# Patient Record
Sex: Male | Born: 1945 | Race: White | Hispanic: No | Marital: Married | Smoking: Former smoker
Health system: Southern US, Community
[De-identification: ages and names within clinical notes are randomized; demographics above are authoritative.]

## PROBLEM LIST (undated history)

## (undated) DIAGNOSIS — E119 Type 2 diabetes mellitus without complications: Secondary | ICD-10-CM

## (undated) DIAGNOSIS — K2270 Barrett's esophagus without dysplasia: Secondary | ICD-10-CM

## (undated) DIAGNOSIS — J449 Chronic obstructive pulmonary disease, unspecified: Secondary | ICD-10-CM

## (undated) DIAGNOSIS — G35 Multiple sclerosis: Secondary | ICD-10-CM

## (undated) HISTORY — PX: APPENDECTOMY: SHX54

---

## 2002-02-23 ENCOUNTER — Emergency Department (HOSPITAL_COMMUNITY): Admission: EM | Admit: 2002-02-23 | Discharge: 2002-02-23 | Payer: Self-pay

## 2004-04-28 ENCOUNTER — Other Ambulatory Visit: Payer: Self-pay

## 2011-10-06 ENCOUNTER — Telehealth: Payer: Self-pay | Admitting: *Deleted

## 2011-10-06 NOTE — Telephone Encounter (Signed)
Opened in error

## 2019-04-15 ENCOUNTER — Emergency Department
Admission: EM | Admit: 2019-04-15 | Discharge: 2019-04-15 | Disposition: A | Discharge status: Home / Self Care | Payer: Medicare Other | Attending: Emergency Medicine | Admitting: Emergency Medicine

## 2019-04-15 ENCOUNTER — Emergency Department: Payer: Medicare Other

## 2019-04-15 ENCOUNTER — Other Ambulatory Visit: Payer: Self-pay

## 2019-04-15 DIAGNOSIS — E119 Type 2 diabetes mellitus without complications: Secondary | ICD-10-CM | POA: Diagnosis not present

## 2019-04-15 DIAGNOSIS — Z20828 Contact with and (suspected) exposure to other viral communicable diseases: Secondary | ICD-10-CM | POA: Insufficient documentation

## 2019-04-15 DIAGNOSIS — Z87891 Personal history of nicotine dependence: Secondary | ICD-10-CM | POA: Diagnosis not present

## 2019-04-15 DIAGNOSIS — J441 Chronic obstructive pulmonary disease with (acute) exacerbation: Secondary | ICD-10-CM | POA: Diagnosis not present

## 2019-04-15 DIAGNOSIS — R0602 Shortness of breath: Secondary | ICD-10-CM | POA: Diagnosis present

## 2019-04-15 HISTORY — DX: Chronic obstructive pulmonary disease, unspecified: J44.9

## 2019-04-15 HISTORY — DX: Type 2 diabetes mellitus without complications: E11.9

## 2019-04-15 LAB — DIFFERENTIAL
Basophils Absolute: 0.1 10*3/uL (ref 0.0–0.1)
Basophils Relative: 1 %
Eosinophils Absolute: 0.7 10*3/uL — ABNORMAL HIGH (ref 0.0–0.5)
Eosinophils Relative: 9 %
Lymphocytes Relative: 13 %
Lymphs Abs: 1 10*3/uL (ref 0.7–4.0)
Monocytes Absolute: 0.8 10*3/uL (ref 0.1–1.0)
Monocytes Relative: 10 %
Neutro Abs: 5.2 10*3/uL (ref 1.7–7.7)
Neutrophils Relative %: 68 %

## 2019-04-15 LAB — CBC
HCT: 44.7 % (ref 39.0–52.0)
Hemoglobin: 15.6 g/dL (ref 13.0–17.0)
MCH: 29.2 pg (ref 26.0–34.0)
MCHC: 34.9 g/dL (ref 30.0–36.0)
MCV: 83.6 fL (ref 80.0–100.0)
Platelets: 281 10*3/uL (ref 150–400)
RBC: 5.35 MIL/uL (ref 4.22–5.81)
RDW: 13.2 % (ref 11.5–15.5)
WBC: 7.8 10*3/uL (ref 4.0–10.5)
nRBC: 0 % (ref 0.0–0.2)

## 2019-04-15 LAB — BASIC METABOLIC PANEL
Anion gap: 7 (ref 5–15)
BUN: 13 mg/dL (ref 8–23)
CO2: 28 mmol/L (ref 22–32)
Calcium: 8.9 mg/dL (ref 8.9–10.3)
Chloride: 96 mmol/L — ABNORMAL LOW (ref 98–111)
Creatinine, Ser: 0.93 mg/dL (ref 0.61–1.24)
GFR calc Af Amer: >60 mL/min (ref >60)
GFR calc non Af Amer: >60 mL/min (ref >60)
Glucose, Bld: 124 mg/dL — ABNORMAL HIGH (ref 70–99)
Potassium: 3.6 mmol/L (ref 3.5–5.1)
Sodium: 131 mmol/L — ABNORMAL LOW (ref 135–145)

## 2019-04-15 LAB — SARS CORONAVIRUS 2 BY RT PCR (HOSPITAL ORDER, PERFORMED IN ~~LOC~~ HOSPITAL LAB): SARS Coronavirus 2: NEGATIVE

## 2019-04-15 MED ORDER — IPRATROPIUM-ALBUTEROL 0.5-2.5 (3) MG/3ML IN SOLN
3.0000 mL | Freq: Once | RESPIRATORY_TRACT | Status: AC
  Administered 2019-04-15: 3 mL via RESPIRATORY_TRACT

## 2019-04-15 MED ORDER — ALBUTEROL SULFATE HFA 108 (90 BASE) MCG/ACT IN AERS: 2.0000 | INHALATION_SPRAY | Freq: Four times a day (QID) | RESPIRATORY_TRACT | 1 refills | Status: DC | PRN

## 2019-04-15 MED ORDER — IPRATROPIUM-ALBUTEROL 0.5-2.5 (3) MG/3ML IN SOLN
3.0000 mL | Freq: Once | RESPIRATORY_TRACT | Status: AC
  Administered 2019-04-15: 08:00:00 3 mL via RESPIRATORY_TRACT
  Filled 2019-04-15: qty 3

## 2019-04-15 MED ORDER — PREDNISONE 50 MG PO TABS: 50.0000 mg | ORAL_TABLET | Freq: Every day | ORAL | 0 refills | Status: DC

## 2019-04-15 MED ORDER — METHYLPREDNISOLONE SODIUM SUCC 125 MG IJ SOLR
125.0000 mg | Freq: Once | INTRAMUSCULAR | Status: AC
  Administered 2019-04-15: 125 mg via INTRAVENOUS
  Filled 2019-04-15: qty 2

## 2019-04-15 MED ORDER — IPRATROPIUM-ALBUTEROL 0.5-2.5 (3) MG/3ML IN SOLN
3.0000 mL | Freq: Once | RESPIRATORY_TRACT | Status: AC
  Administered 2019-04-15: 3 mL via RESPIRATORY_TRACT
  Filled 2019-04-15: qty 3

## 2019-04-15 NOTE — ED Notes (Signed)
Pt up to toilet in room.

## 2019-04-15 NOTE — ED Provider Notes (Signed)
[facility] Emergency Department Provider Note   ____________________________________________    I have reviewed the triage vital signs and the nursing notes.   HISTORY  Chief Complaint Shortness of Breath     HPI Mike Parks is a 73 y.o. male who presents with complaints of shortness of breath.  Patient has a history of COPD and diabetes.  He is typically cared for as an outpatient by [facility], but reports his next appointment is not till Wednesday and he has been feeling short of breath and coughing nonstop and feels unable to wait that long.  He denies fevers or chills or myalgias.  No nausea vomiting or diaphoresis, some chest discomfort from coughing, positive productive cough.  No known exposure to COVID-19  Past Medical History:  Diagnosis Date  . COPD (chronic obstructive pulmonary disease) (HCC)   . Diabetes mellitus without complication (HCC)     There are no active problems to display for this patient.   Past Surgical History:  Procedure Laterality Date  . APPENDECTOMY      Prior to Admission medications   Medication Sig Start Date End Date Taking? Authorizing Provider  albuterol (VENTOLIN HFA) 108 (90 Base) MCG/ACT inhaler Inhale 2 puffs into the lungs every 6 (six) hours as needed for wheezing or shortness of breath. 04/15/19   Jene Every, MD  predniSONE (DELTASONE) 50 MG tablet Take 1 tablet (50 mg total) by mouth daily with breakfast. 04/15/19   Jene Every, MD     Allergies Patient has no known allergies.  No family history on file.  Social History Social History   Tobacco Use  . Smoking status: Former Games developer  . Smokeless tobacco: Never Used  Substance Use Topics  . Alcohol use: Not Currently  . Drug use: Not Currently    Review of Systems  Constitutional: No fever/chills Eyes: No visual changes.  ENT: No sore throat. Cardiovascular: Chest discomfort from coughing Respiratory: Short of breath,  cough, productive Gastrointestinal: No abdominal pain.  No nausea, no vomiting.   Genitourinary: Negative for dysuria. Musculoskeletal: No myalgias Skin: Negative for rash. Neurological: Negative for headaches or weakness   ____________________________________________   PHYSICAL EXAM:  VITAL SIGNS: ED Triage Vitals [04/15/19 0703]  Enc Vitals Group     BP (!) 142/78     Pulse Rate 75     Resp 18     Temp 97.8 F (36.6 C)     Temp Source Oral     SpO2 93 %     Weight 79.4 kg (175 lb)     Height 1.727 m (5\' 8" )     Head Circumference      Peak Flow      Pain Score 10     Pain Loc      Pain Edu?      Excl. in GC?     Constitutional: Alert and oriented. No acute distress. Pleasant and interactive  Nose: No congestion/rhinnorhea. Mouth/Throat: Mucous membranes are moist.    Cardiovascular: Normal rate, regular rhythm. Grossly normal heart sounds.  Good peripheral circulation. Respiratory: Mildly increased respiratory effort, diffuse wheezing Gastrointestinal: Soft and nontender. No distention.  No CVA tenderness.  Musculoskeletal:   Warm and well perfused Neurologic:  Normal speech and language. No gross focal neurologic deficits are appreciated.  Skin:  Skin is warm, dry and intact. No rash noted. Psychiatric: Mood and affect are normal. Speech and behavior are normal.  ____________________________________________   LABS (all labs ordered  are listed, but only abnormal results are displayed)  Labs Reviewed  BASIC METABOLIC PANEL - Abnormal; Notable for the following components:      Result Value   Sodium 131 (*)    Chloride 96 (*)    Glucose, Bld 124 (*)    All other components within normal limits  DIFFERENTIAL - Abnormal; Notable for the following components:   Eosinophils Absolute 0.7 (*)    All other components within normal limits  SARS CORONAVIRUS 2 (HOSPITAL ORDER, [place] [facility])  CBC  CBC WITH DIFFERENTIAL/PLATELET    ____________________________________________  EKG  ED ECG REPORT I, Lavonia Drafts, the attending physician, personally viewed and interpreted this ECG.  Date: 04/15/2019  Rhythm: normal sinus rhythm QRS Axis: normal Intervals: normal ST/T Wave abnormalities: normal Narrative Interpretation: no evidence of acute ischemia  ____________________________________________  RADIOLOGY  Chest x-ray ____________________________________________   PROCEDURES  Procedure(s) performed: No  Procedures   Critical Care performed: No ____________________________________________   INITIAL IMPRESSION / ASSESSMENT AND PLAN / ED COURSE  Pertinent labs & imaging results that were available during my care of the patient were reviewed by me and considered in my medical decision making (see chart for details).  Patient presents with worsening cough, shortness of breath, COVID-19 is on the differential however this appears to be more consistent with COPD exacerbation given his diffuse wheezing on exam.  Afebrile, no myalgias or known exposures.  Bacterial pneumonia is also certainly possibility, pending chest x-ray, will treat with IV Solu-Medrol, duo nebs and reevaluate.   ----------------------------------------- 9:37 AM on 04/15/2019 -----------------------------------------  Patient's lab work is overall reassuring, he feels much better after 2 DuoNeb's, still has some wheezing on exam, I recommended admission to the hospital but he refuses, he will not stay in the hospital.  Given this we will give an additional DuoNeb and prescribed albuterol and steroids with extremely strict return precautions    ____________________________________________   FINAL CLINICAL IMPRESSION(S) / ED DIAGNOSES  Final diagnoses:  COPD exacerbation ([place])        Note:  This document was prepared using Dragon voice recognition software and may include unintentional dictation errors.   Lavonia Drafts,  MD 04/15/19 [phone]

## 2019-04-15 NOTE — ED Notes (Signed)
Pt requesting to go home.

## 2019-04-15 NOTE — ED Triage Notes (Signed)
Pt c/o cough with increased SOB this week, states he has chronic bronchitis that he gets treated for at the [place]. Pt has constant congested cough in triage.

## 2019-06-21 ENCOUNTER — Encounter: Payer: Self-pay | Admitting: Emergency Medicine

## 2019-06-21 ENCOUNTER — Emergency Department
Admission: EM | Admit: 2019-06-21 | Discharge: 2019-06-21 | Disposition: A | Discharge status: Home / Self Care | Payer: Medicare Other | Attending: Emergency Medicine | Admitting: Emergency Medicine

## 2019-06-21 ENCOUNTER — Emergency Department: Payer: Medicare Other

## 2019-06-21 ENCOUNTER — Other Ambulatory Visit: Payer: Self-pay

## 2019-06-21 DIAGNOSIS — Z87891 Personal history of nicotine dependence: Secondary | ICD-10-CM | POA: Diagnosis not present

## 2019-06-21 DIAGNOSIS — J441 Chronic obstructive pulmonary disease with (acute) exacerbation: Secondary | ICD-10-CM | POA: Diagnosis not present

## 2019-06-21 DIAGNOSIS — E119 Type 2 diabetes mellitus without complications: Secondary | ICD-10-CM | POA: Diagnosis not present

## 2019-06-21 DIAGNOSIS — R0602 Shortness of breath: Secondary | ICD-10-CM | POA: Diagnosis present

## 2019-06-21 LAB — CBC WITH DIFFERENTIAL/PLATELET
Abs Immature Granulocytes: 0.02 10*3/uL (ref 0.00–0.07)
Basophils Absolute: 0.1 10*3/uL (ref 0.0–0.1)
Basophils Relative: 1 %
Eosinophils Absolute: 0.8 10*3/uL — ABNORMAL HIGH (ref 0.0–0.5)
Eosinophils Relative: 13 %
HCT: 43.5 % (ref 39.0–52.0)
Hemoglobin: 14.5 g/dL (ref 13.0–17.0)
Immature Granulocytes: 0 %
Lymphocytes Relative: 14 %
Lymphs Abs: 0.9 10*3/uL (ref 0.7–4.0)
MCH: 29.7 pg (ref 26.0–34.0)
MCHC: 33.3 g/dL (ref 30.0–36.0)
MCV: 89 fL (ref 80.0–100.0)
Monocytes Absolute: 0.6 10*3/uL (ref 0.1–1.0)
Monocytes Relative: 10 %
Neutro Abs: 3.7 10*3/uL (ref 1.7–7.7)
Neutrophils Relative %: 62 %
Platelets: 288 10*3/uL (ref 150–400)
RBC: 4.89 MIL/uL (ref 4.22–5.81)
RDW: 13.9 % (ref 11.5–15.5)
WBC: 6 10*3/uL (ref 4.0–10.5)
nRBC: 0 % (ref 0.0–0.2)

## 2019-06-21 LAB — COMPREHENSIVE METABOLIC PANEL
ALT: 11 U/L (ref 0–44)
AST: 14 U/L — ABNORMAL LOW (ref 15–41)
Albumin: 3.9 g/dL (ref 3.5–5.0)
Alkaline Phosphatase: 94 U/L (ref 38–126)
Anion gap: 8 (ref 5–15)
BUN: 18 mg/dL (ref 8–23)
CO2: 29 mmol/L (ref 22–32)
Calcium: 9.3 mg/dL (ref 8.9–10.3)
Chloride: 103 mmol/L (ref 98–111)
Creatinine, Ser: 1.03 mg/dL (ref 0.61–1.24)
GFR calc Af Amer: >60 mL/min (ref >60)
GFR calc non Af Amer: >60 mL/min (ref >60)
Glucose, Bld: 127 mg/dL — ABNORMAL HIGH (ref 70–99)
Potassium: 3.9 mmol/L (ref 3.5–5.1)
Sodium: 140 mmol/L (ref 135–145)
Total Bilirubin: 0.4 mg/dL (ref 0.3–1.2)
Total Protein: 7.1 g/dL (ref 6.5–8.1)

## 2019-06-21 LAB — TROPONIN I (HIGH SENSITIVITY): Troponin I (High Sensitivity): 4 ng/L (ref <18)

## 2019-06-21 MED ORDER — ALBUTEROL SULFATE HFA 108 (90 BASE) MCG/ACT IN AERS: 2.0000 | INHALATION_SPRAY | Freq: Four times a day (QID) | RESPIRATORY_TRACT | 1 refills | Status: DC | PRN

## 2019-06-21 MED ORDER — IPRATROPIUM-ALBUTEROL 0.5-2.5 (3) MG/3ML IN SOLN
3.0000 mL | Freq: Once | RESPIRATORY_TRACT | Status: AC
  Administered 2019-06-21: 06:00:00 3 mL via RESPIRATORY_TRACT
  Filled 2019-06-21: qty 3

## 2019-06-21 MED ORDER — ALBUTEROL SULFATE (2.5 MG/3ML) 0.083% IN NEBU
5.0000 mg | INHALATION_SOLUTION | Freq: Once | RESPIRATORY_TRACT | Status: AC
  Administered 2019-06-21: 07:00:00 5 mg via RESPIRATORY_TRACT
  Filled 2019-06-21: qty 6

## 2019-06-21 MED ORDER — PREDNISONE 20 MG PO TABS: 60.0000 mg | ORAL_TABLET | Freq: Every day | ORAL | 0 refills | Status: AC

## 2019-06-21 MED ORDER — AZITHROMYCIN 500 MG PO TABS
500.0000 mg | ORAL_TABLET | Freq: Once | ORAL | Status: AC
  Administered 2019-06-21: 500 mg via ORAL
  Filled 2019-06-21: qty 1

## 2019-06-21 MED ORDER — IPRATROPIUM-ALBUTEROL 0.5-2.5 (3) MG/3ML IN SOLN
3.0000 mL | Freq: Once | RESPIRATORY_TRACT | Status: AC
  Administered 2019-06-21: 3 mL via RESPIRATORY_TRACT
  Filled 2019-06-21: qty 3

## 2019-06-21 MED ORDER — METHYLPREDNISOLONE SODIUM SUCC 125 MG IJ SOLR
125.0000 mg | Freq: Once | INTRAMUSCULAR | Status: AC
  Administered 2019-06-21: 06:00:00 125 mg via INTRAVENOUS
  Filled 2019-06-21: qty 2

## 2019-06-21 MED ORDER — AZITHROMYCIN 250 MG PO TABS: ORAL_TABLET | ORAL | 0 refills | Status: DC

## 2019-06-21 NOTE — ED Provider Notes (Signed)
[facility] Emergency Department Provider Note  ____________________________________________  Time seen: Approximately 6:10 AM  I have reviewed the triage vital signs and the nursing notes.   HISTORY  Chief Complaint Shortness of Breath   HPI APOLO CUTSHAW is a 73 y.o. male with a history of COPD and diabetes presents for evaluation of shortness of breath.  Patient has had a cough productive of white sputum and Parks worsening shortness of breath for the last 2 days.  No fever or chills, no body aches, no known exposures to COVID, no vomiting or diarrhea, no chest pain.  No personal family history of blood clots, no recent travel immobilization, no leg pain or swelling, no hemoptysis, no exogenous hormones.  Patient has been using his inhalers at home.   Past Medical History:  Diagnosis Date  . COPD (chronic obstructive pulmonary disease) ([place])   . Diabetes mellitus without complication [facility])      Past Surgical History:  Procedure Laterality Date  . APPENDECTOMY      Prior to Admission medications   Medication Sig Start Date End Date Taking? Authorizing Provider  albuterol (VENTOLIN HFA) 108 (90 Base) MCG/ACT inhaler Inhale 2 puffs into the lungs every 6 (six) hours as needed for wheezing or shortness of breath. 04/15/19   Lavonia Drafts, MD  albuterol (VENTOLIN HFA) 108 (90 Base) MCG/ACT inhaler Inhale 2 puffs into the lungs every 6 (six) hours as needed for wheezing or shortness of breath. 06/21/19   Rudene Re, MD  azithromycin [facility]) 250 MG tablet Take 1 a day for 4 days 06/21/19   Alfred Levins, [place], MD  predniSONE (DELTASONE) 20 MG tablet Take 3 tablets (60 mg total) by mouth daily for 4 days. 06/21/19 06/25/19  Rudene Re, MD    Allergies Patient has no known allergies.  No family history on file.  Social History Social History   Tobacco Use  . Smoking status: Former Research scientist (life sciences)  . Smokeless tobacco: Never Used   Substance Use Topics  . Alcohol use: Not Currently  . Drug use: Not Currently    Review of Systems  Constitutional: Negative for fever. Eyes: Negative for visual changes. ENT: Negative for sore throat. Neck: No neck pain  Cardiovascular: Negative for chest pain. Respiratory: + shortness of breath, cough Gastrointestinal: Negative for abdominal pain, vomiting or diarrhea. Genitourinary: Negative for dysuria. Musculoskeletal: Negative for back pain. Skin: Negative for rash. Neurological: Negative for headaches, weakness or numbness. Psych: No SI or HI  ____________________________________________   PHYSICAL EXAM:  VITAL SIGNS: ED Triage Vitals  Enc Vitals Group     BP 06/21/19 0537 138/65     Pulse Rate 06/21/19 0537 75     Resp 06/21/19 0537 (!) 21     Temp 06/21/19 0537 (!) 97.5 F (36.4 C)     Temp Source 06/21/19 0537 Oral     SpO2 06/21/19 0537 93 %     Weight 06/21/19 0536 170 lb (77.1 kg)     Height 06/21/19 0536 5\' 8"  (1.727 m)     Head Circumference --      Peak Flow --      Pain Score 06/21/19 0536 0     Pain Loc --      Pain Edu? --      Excl. in [place]? --     Constitutional: Alert and oriented, mild respiratory distress.  HEENT:      Head: Normocephalic and atraumatic.         Eyes:  Conjunctivae are normal. Sclera is non-icteric.       Mouth/Throat: Mucous membranes are moist.       Neck: Supple with no signs of meningismus. Cardiovascular: Regular rate and rhythm. No murmurs, gallops, or rubs. 2+ symmetrical distal pulses are present in all extremities. No JVD. Respiratory: Increased work of breathing, tachypneic, diffuse expiratory wheezes with coarse rhonchi bilaterally Gastrointestinal: Soft, non tender, and non distended with positive bowel sounds. No rebound or guarding. Musculoskeletal: Nontender with normal range of motion in all extremities. No edema, cyanosis, or erythema of extremities. Neurologic: Normal speech and language. Face is  symmetric. Moving all extremities. No gross focal neurologic deficits are appreciated. Skin: Skin is warm, dry and intact. No rash noted. Psychiatric: Mood and affect are normal. Speech and behavior are normal.  ____________________________________________   LABS (all labs ordered are listed, but only abnormal results are displayed)  Labs Reviewed  CBC WITH DIFFERENTIAL/PLATELET - Abnormal; Notable for the following components:      Result Value   Eosinophils Absolute 0.8 (*)    All other components within normal limits  COMPREHENSIVE METABOLIC PANEL - Abnormal; Notable for the following components:   Glucose, Bld 127 (*)    AST 14 (*)    All other components within normal limits  TROPONIN I (HIGH SENSITIVITY)   ____________________________________________  EKG  ED ECG REPORT I, Nita Sickle, the attending physician, personally viewed and interpreted this ECG.  Normal sinus rhythm, rate of 71, normal intervals, normal axis, no ST elevations or depressions.  Normal EKG ____________________________________________  RADIOLOGY  I have personally reviewed the images performed during this visit and I agree with the Radiologist's read.   Interpretation by Radiologist:  Dg Chest Portable 1 View  Result Date: 06/21/2019 CLINICAL DATA:  Shortness of breath and cough EXAM: PORTABLE CHEST 1 VIEW COMPARISON:  04/15/2019 FINDINGS: Cardiac shadow is stable. Calcified granulomas are again noted on the left. The lungs are well aerated bilaterally without focal infiltrate or sizable effusion. No bony abnormality is noted. IMPRESSION: No acute abnormality seen. Electronically Signed   By: Alcide Clever M.D.   On: 06/21/2019 06:23      ____________________________________________   PROCEDURES  Procedure(s) performed: None Procedures Critical Care performed:  None ____________________________________________   INITIAL IMPRESSION / ASSESSMENT AND PLAN / ED COURSE   73 y.o. male  with a history of COPD and diabetes presents for evaluation of shortness of breath and cough x 2 days.  Ddx COPD exacerbation, PNA, bronchitis, COVID  Patient arrives in mild respiratory distress, tachypneic with no oxygen requirement, he has diffuse expiratory wheezes and coarse rhonchi bilaterally.  He looks euvolemic.  EKG with no evidence of ischemia or dysrhythmias.  With no body aches, fever, and known exposure to COVID, low suspicion for such at this time. CXR showed no evidence of PNA or pulmonary edema. Patient given duoneb x 3 and 125mg  of IV solumedrol, and azithromycin. Will reassess once labs are back.     _________________________ 6:48 AM on 06/21/2019 -----------------------------------------  Labs within normal limits.  Patient feels improved but still with some wheezing.  Patient does not wish to be admitted to the hospital.  Will give 1 more dose of albuterol 5 mg and reassess for disposition.    As part of my medical decision making, I reviewed the following data within the electronic MEDICAL RECORD NUMBER Nursing notes reviewed and incorporated, Labs reviewed , EKG interpreted , Old EKG reviewed, Old chart reviewed, Radiograph reviewed , Notes from  prior ED visits and [place] Controlled Substance Database   Patient was evaluated in Emergency Department today for the symptoms described in the history of present illness. Patient was evaluated in the context of the global COVID-19 pandemic, which necessitated consideration that the patient might be at risk for infection with the SARS-CoV-2 virus that causes COVID-19. Institutional protocols and algorithms that pertain to the evaluation of patients at risk for COVID-19 are in a state of rapid change based on information released by regulatory bodies including the CDC and federal and state organizations. These policies and algorithms were followed during the patient's care in the ED.   ____________________________________________   FINAL  CLINICAL IMPRESSION(S) / ED DIAGNOSES   Final diagnoses:  COPD exacerbation (HCC)      NEW MEDICATIONS STARTED DURING THIS VISIT:  ED Discharge Orders         Ordered    predniSONE (DELTASONE) 20 MG tablet  Daily     06/21/19 0631    azithromycin (ZITHROMAX) 250 MG tablet     06/21/19 0631    albuterol (VENTOLIN HFA) 108 (90 Base) MCG/ACT inhaler  Every 6 hours PRN     06/21/19 0631           Note:  This document was prepared using Dragon voice recognition software and may include unintentional dictation errors.    Don PerkingVeronese, [place], MD 06/21/19 [phone]

## 2019-06-21 NOTE — ED Notes (Signed)
Ambulated pt in hallway with monitor on while watching O2. O2 levels stayed between 94-100% while ambulating. MD notified.

## 2019-06-21 NOTE — ED Triage Notes (Signed)
Patient ambulatory to triage with steady gait, without difficulty or distress noted; pt reports prod cough clear sputum with [facility] since yesterday; denies any other accomp symptoms

## 2019-06-21 NOTE — ED Provider Notes (Signed)
6:59 AM Assumed care for off going team.   Blood pressure 130/69, pulse 69, temperature (!) 97.5 F (36.4 C), temperature source Oral, resp. rate (!) 25, height 5\' 8"  (1.727 m), weight 77.1 kg, SpO2 100 %.  See their HPI for full report but in brief   Copd, here with copd exacerbation. No fever. Labs normal. EKG/chest xray negative.  Duoneb x3, steroids.  Getting another albuterol and pt prefers to go home.  So plan to re-assess after albuterol.   7:51 AM reevaluated patient.  Patient received his albuterol.  Patient continues to have some bilateral wheezing although he thinks that he always has some minimal amount of wheezing present.  He feels much improved from prior.  I again offered patient admission given the continued wheezing however he declined.  Patient said he would prefer to follow-up outpatient.  Patient is satting 98% on room air.  He has no increased work of breathing and normal respiratory rate.  He looks very comfortable.  Ambulatory saturation between 95-100% without increased WOB  And subjectively felt well. Given this pt will be d/c with PCP f/u on Monday or return to Ed if symptoms worsen.   I discussed the provisional nature of ED diagnosis, the treatment so far, the ongoing plan of care, follow up appointments and return precautions with the patient and any family or support people present. They expressed understanding and agreed with the plan, discharged home.           Vanessa [place], MD 06/21/19 [phone]

## 2019-06-21 NOTE — ED Notes (Signed)
Dr. Funke at bedside. 

## 2019-06-21 NOTE — Discharge Instructions (Addendum)
We are treating you for COPD flare. Return to Ed for fevers, worsening shortness of breath or any other concerns.

## 2020-10-24 ENCOUNTER — Emergency Department
Admission: EM | Admit: 2020-10-24 | Discharge: 2020-10-24 | Disposition: A | Discharge status: Home / Self Care | Payer: Medicare Other | Attending: Emergency Medicine | Admitting: Emergency Medicine

## 2020-10-24 ENCOUNTER — Encounter: Payer: Self-pay | Admitting: Emergency Medicine

## 2020-10-24 ENCOUNTER — Other Ambulatory Visit: Payer: Self-pay

## 2020-10-24 DIAGNOSIS — Z87891 Personal history of nicotine dependence: Secondary | ICD-10-CM | POA: Insufficient documentation

## 2020-10-24 DIAGNOSIS — R633 Feeding difficulties, unspecified: Secondary | ICD-10-CM | POA: Diagnosis present

## 2020-10-24 DIAGNOSIS — E119 Type 2 diabetes mellitus without complications: Secondary | ICD-10-CM | POA: Insufficient documentation

## 2020-10-24 DIAGNOSIS — J449 Chronic obstructive pulmonary disease, unspecified: Secondary | ICD-10-CM | POA: Insufficient documentation

## 2020-10-24 HISTORY — DX: Barrett's esophagus without dysplasia: K22.70

## 2020-10-24 HISTORY — DX: Multiple sclerosis: G35

## 2020-10-24 NOTE — ED Notes (Signed)
Pt verbalizes understanding of d/c instructions and follow up. 

## 2020-10-24 NOTE — ED Provider Notes (Signed)
[facility] Emergency Department Provider Note   ____________________________________________    I have reviewed the triage vital signs and the nursing notes.   HISTORY  Chief Complaint Other and feeding tube     HPI Mike Parks is a 75 y.o. male who presents because he recently saw his PCP who told him that he could either go on hospice care or have a feeding tube placed.  He had initially decided to have hospice care but has changed his mind and decided that he wanted a PEG tube so he is presented to our emergency department.  He is a [place] patient.  He has no acute conditions at this time  Past Medical History:  Diagnosis Date  . Barrett esophagus   . COPD (chronic obstructive pulmonary disease) (HCC)   . Diabetes mellitus without complication (HCC)   . Multiple sclerosis (HCC)     There are no problems to display for this patient.   Past Surgical History:  Procedure Laterality Date  . APPENDECTOMY      Prior to Admission medications   Medication Sig Start Date End Date Taking? Authorizing Provider  albuterol (VENTOLIN HFA) 108 (90 Base) MCG/ACT inhaler Inhale 2 puffs into the lungs every 6 (six) hours as needed for wheezing or shortness of breath. 04/15/19   Jene Every, MD  albuterol (VENTOLIN HFA) 108 (90 Base) MCG/ACT inhaler Inhale 2 puffs into the lungs every 6 (six) hours as needed for wheezing or shortness of breath. 06/21/19   Nita Sickle, MD  atorvastatin (LIPITOR) 40 MG tablet Take 20 mg by mouth daily. 03/20/19   [provider]  azithromycin (ZITHROMAX) 250 MG tablet Take 1 a day for 4 days 06/21/19   Don Perking, [place], MD  baclofen (LIORESAL) 10 MG tablet Take 10 mg by mouth 2 (two) times daily. 04/29/19   [provider]  gabapentin (NEURONTIN) 300 MG capsule Take 300 mg by mouth 3 (three) times daily. 04/29/19   [provider]  omeprazole (PRILOSEC) 20 MG capsule Take 40 mg by mouth 2 (two)  times daily. 06/17/19   [provider]  STIOLTO RESPIMAT 2.5-2.5 MCG/ACT AERS Inhale 2 puffs into the lungs daily. 06/17/19   [provider]  triamterene-hydrochlorothiazide (MAXZIDE-25) 37.5-25 MG tablet Take 1 tablet by mouth daily. 04/29/19   [provider]     Allergies Patient has no known allergies.  No family history on file.  Social History Social History   Tobacco Use  . Smoking status: Former Games developer  . Smokeless tobacco: Never Used  Substance Use Topics  . Alcohol use: Not Currently  . Drug use: Not Currently    Review of Systems  Constitutional: No fever/chills  Chest: No chest pain   Gastrointestinal: No abdominal pain.    Musculoskeletal: Negative for back pain. Skin: Negative for rash. Neurological: Negative for headaches     ____________________________________________   PHYSICAL EXAM:  VITAL SIGNS: ED Triage Vitals [10/24/20 0741]  Enc Vitals Group     BP 125/85     Pulse Rate 74     Resp 20     Temp 98 F (36.7 C)     Temp Source Oral     SpO2 96 %     Weight 62.6 kg (138 lb)     Height 1.727 m (5\' 8" )     Head Circumference      Peak Flow      Pain Score 0     Pain  Loc      Pain Edu?      Excl. in GC?      Constitutional: Alert and oriented. No acute distress.  Eyes: Conjunctivae are normal.   Nose: No congestion/rhinnorhea. Mouth/Throat: Mucous membranes are moist.   Cardiovascular: Normal rate, regular rhythm.  Respiratory: Normal respiratory effort.  No retractions.  Musculoskeletal: No lower extremity tenderness nor edema.   Neurologic:  Normal speech and language. No gross focal neurologic deficits are appreciated.   Skin:  Skin is warm, dry and intact. No rash noted.   ____________________________________________   LABS (all labs ordered are listed, but only abnormal results are displayed)  Labs Reviewed - No data to  display ____________________________________________  EKG   ____________________________________________  RADIOLOGY  None ____________________________________________   PROCEDURES  Procedure(s) performed: No  Procedures   Critical Care performed: No ____________________________________________   INITIAL IMPRESSION / ASSESSMENT AND PLAN / ED COURSE  Pertinent labs & imaging results that were available during my care of the patient were reviewed by me and considered in my medical decision making (see chart for details).  Patient with chronic issues related to feeding, examined by me and overall chronically ill-appearing but no acute conditions at this time.  No acute complaints or emergent conditions.   Discussed with him that given no acute emergent condition at this time most expeditious way to achieve his goal would be to go to the [facility] directly because of severe overcrowding related to the Covid pandemic, he says that his son can take him there   ____________________________________________   FINAL CLINICAL IMPRESSION(S) / ED DIAGNOSES  Final diagnoses:  Feeding difficulties      NEW MEDICATIONS STARTED DURING THIS VISIT:  New Prescriptions   No medications on file     Note:  This document was prepared using Dragon voice recognition software and may include unintentional dictation errors.   Jene Every, MD 10/24/20 [phone]

## 2020-10-24 NOTE — ED Notes (Signed)
PT did not want to wait for printed d/c instructions or signature.  Pt's son here to pick him up.  Pt verbalizes understanding of all information shared.

## 2020-10-24 NOTE — ED Triage Notes (Signed)
Pt reports he needs to be transferred to the [place] in [place] and he was told to come here for the transfer

## 2020-10-24 NOTE — ED Triage Notes (Signed)
Pt reports was seen by his MD yesterday and told he had 2 choices, he could either go into hospice care or get a feeding tube. Pt reports he chose hospice but has since changed his mind and now wants a feeding tube. 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

## 2021-02-24 ENCOUNTER — Emergency Department: Payer: Medicare PPO

## 2021-02-24 ENCOUNTER — Emergency Department
Admission: EM | Admit: 2021-02-24 | Discharge: 2021-02-24 | Disposition: A | Discharge status: Home / Self Care | Payer: Medicare PPO | Attending: Emergency Medicine | Admitting: Emergency Medicine

## 2021-02-24 DIAGNOSIS — E119 Type 2 diabetes mellitus without complications: Secondary | ICD-10-CM | POA: Insufficient documentation

## 2021-02-24 DIAGNOSIS — S63259A Unspecified dislocation of unspecified finger, initial encounter: Secondary | ICD-10-CM

## 2021-02-24 DIAGNOSIS — Z7951 Long term (current) use of inhaled steroids: Secondary | ICD-10-CM | POA: Insufficient documentation

## 2021-02-24 DIAGNOSIS — S42202A Unspecified fracture of upper end of left humerus, initial encounter for closed fracture: Secondary | ICD-10-CM | POA: Diagnosis not present

## 2021-02-24 DIAGNOSIS — S63257A Unspecified dislocation of left little finger, initial encounter: Secondary | ICD-10-CM | POA: Diagnosis not present

## 2021-02-24 DIAGNOSIS — S4992XA Unspecified injury of left shoulder and upper arm, initial encounter: Secondary | ICD-10-CM | POA: Diagnosis present

## 2021-02-24 DIAGNOSIS — Z87891 Personal history of nicotine dependence: Secondary | ICD-10-CM | POA: Insufficient documentation

## 2021-02-24 DIAGNOSIS — W108XXA Fall (on) (from) other stairs and steps, initial encounter: Secondary | ICD-10-CM | POA: Diagnosis not present

## 2021-02-24 DIAGNOSIS — J449 Chronic obstructive pulmonary disease, unspecified: Secondary | ICD-10-CM | POA: Diagnosis not present

## 2021-02-24 DIAGNOSIS — Y9301 Activity, walking, marching and hiking: Secondary | ICD-10-CM | POA: Insufficient documentation

## 2021-02-24 DIAGNOSIS — G35 Multiple sclerosis: Secondary | ICD-10-CM | POA: Insufficient documentation

## 2021-02-24 DIAGNOSIS — W19XXXA Unspecified fall, initial encounter: Secondary | ICD-10-CM

## 2021-02-24 MED ORDER — MORPHINE SULFATE (PF) 4 MG/ML IV SOLN
4.0000 mg | Freq: Once | INTRAVENOUS | Status: AC
  Administered 2021-02-24: 4 mg via INTRAVENOUS
  Filled 2021-02-24: qty 1

## 2021-02-24 MED ORDER — OXYCODONE-ACETAMINOPHEN 5-325 MG PO TABS: 1.0000 | ORAL_TABLET | ORAL | 0 refills | Status: DC | PRN

## 2021-02-24 MED ORDER — ONDANSETRON HCL 4 MG/2ML IJ SOLN
4.0000 mg | Freq: Once | INTRAMUSCULAR | Status: AC
  Administered 2021-02-24: 4 mg via INTRAVENOUS
  Filled 2021-02-24: qty 2

## 2021-02-24 MED ORDER — LIDOCAINE HCL (PF) 1 % IJ SOLN
5.0000 mL | Freq: Once | INTRAMUSCULAR | Status: AC
  Administered 2021-02-24: 5 mL
  Filled 2021-02-24: qty 5

## 2021-02-24 NOTE — ED Provider Notes (Signed)
[facility] Emergency Department Provider Note  Time seen: 8:35 PM  I have reviewed the triage vital signs and the nursing notes.   HISTORY  Chief Complaint Fall   HPI Mike Parks is a 75 y.o. male with a past medical history of COPD, diabetes, MS, presents to the emergency department for a fall.  According to the patient he was going up steps when he tripped falling forwards onto his left side.  Patient is having pain in his left small finger as well as left shoulder.  Does not believe he hit his head.  No LOC.  Patient states 9/10 pain mostly in the left shoulder.   Past Medical History:  Diagnosis Date  . Barrett esophagus   . COPD (chronic obstructive pulmonary disease) (HCC)   . Diabetes mellitus without complication (HCC)   . Multiple sclerosis (HCC)     There are no problems to display for this patient.   Past Surgical History:  Procedure Laterality Date  . APPENDECTOMY      Prior to Admission medications   Medication Sig Start Date End Date Taking? Authorizing Provider  albuterol (VENTOLIN HFA) 108 (90 Base) MCG/ACT inhaler Inhale 2 puffs into the lungs every 6 (six) hours as needed for wheezing or shortness of breath. 04/15/19   Jene Every, MD  albuterol (VENTOLIN HFA) 108 (90 Base) MCG/ACT inhaler Inhale 2 puffs into the lungs every 6 (six) hours as needed for wheezing or shortness of breath. 06/21/19   Nita Sickle, MD  atorvastatin (LIPITOR) 40 MG tablet Take 20 mg by mouth daily. 03/20/19   [provider]  azithromycin (ZITHROMAX) 250 MG tablet Take 1 a day for 4 days 06/21/19   Don Perking, [place], MD  baclofen (LIORESAL) 10 MG tablet Take 10 mg by mouth 2 (two) times daily. 04/29/19   [provider]  gabapentin (NEURONTIN) 300 MG capsule Take 300 mg by mouth 3 (three) times daily. 04/29/19   [provider]  omeprazole (PRILOSEC) 20 MG capsule Take 40 mg by mouth 2 (two) times daily. 06/17/19   [provider]  STIOLTO RESPIMAT 2.5-2.5 MCG/ACT AERS Inhale 2 puffs into the lungs daily. 06/17/19   [provider]  triamterene-hydrochlorothiazide (MAXZIDE-25) 37.5-25 MG tablet Take 1 tablet by mouth daily. 04/29/19   [provider]    No Known Allergies  No family history on file.  Social History Social History   Tobacco Use  . Smoking status: Former Games developer  . Smokeless tobacco: Never Used  Substance Use Topics  . Alcohol use: Not Currently  . Drug use: Not Currently    Review of Systems Constitutional: Negative for loss of consciousness. Cardiovascular: Negative for chest pain. Respiratory: Negative for shortness of breath. Gastrointestinal: Negative for abdominal pain, vomiting Musculoskeletal: Left shoulder pain and swelling.  Left fifth finger pain Skin: Cut to the patient's left fifth finger Neurological: Negative for headache All other ROS negative  ____________________________________________   PHYSICAL EXAM:  VITAL SIGNS: ED Triage Vitals  Enc Vitals Group     BP 02/24/21 2021 131/76     Pulse Rate 02/24/21 2021 (!) 58     Resp 02/24/21 2021 18     Temp 02/24/21 2021 97.7 F (36.5 C)     Temp Source 02/24/21 2021 Oral     SpO2 02/24/21 2021 100 %     Weight 02/24/21 2021 148 lb (67.1 kg)     Height 02/24/21 2021 5\' 8"  (1.727 m)  Head Circumference --      Peak Flow --      Pain Score 02/24/21 2017 10     Pain Loc --      Pain Edu? --      Excl. in GC? --     Constitutional: Alert. Well appearing and in no distress. Eyes: Normal exam ENT      Head: Normocephalic and atraumatic.      Mouth/Throat: Mucous membranes are moist. Cardiovascular: Normal rate, regular rhythm.  Respiratory: Normal respiratory effort without tachypnea nor retractions. Breath sounds are clear Gastrointestinal: Soft and nontender. No distention.  Musculoskeletal: Patient does have a deformity to the left fifth finger consistent with a possible  dislocation versus fracture.  Also has swelling and tenderness of the left shoulder with pain with range of motion but neurovascular intact distally. Neurologic:  Normal speech and language. No gross focal neurologic deficits  Skin:  Skin is warm.  Small laceration at the base of the fifth left finger. Psychiatric: Mood and affect are normal.  ____________________________________________   RADIOLOGY  Finger x-ray shows dorsal dislocation of the fifth PIP joint. Shoulder x-ray shows acute displaced proximal humerus fracture CT head negative ____________________________________________   INITIAL IMPRESSION / ASSESSMENT AND PLAN / ED COURSE  Pertinent labs & imaging results that were available during my care of the patient were reviewed by me and considered in my medical decision making (see chart for details).   Patient presents emergency department for mechanical fall going up steps falling onto his left side.  Patient is having pain in his left fifth finger as well as left shoulder.  We will obtain a CT scan of the head as a precaution as well.  We will obtain x-ray images of left shoulder and left little finger.  We will continue to closely monitor we will treat with pain medication while awaiting results.  Patient agreeable to plan of care.  Patient has an acute displaced proximal humerus fracture on x-ray we will place the patient in a shoulder immobilizer discharged with pain medication and orthopedic follow-up.  Patient also has 1/5 PIP joint dislocation.  I performed a digital block and was able to reduce the digit.  We will place in a finger splint.  Patient does have a very small laceration to the base of the fifth digit.  Does not want sutures.  After reduction the cut remains closed and hemostatic we will cover with Xeroform placed that finger and aluminum splint and have the patient follow-up with orthopedics.  Patient agreeable to plan of care.  Mike Parks was evaluated in  Emergency Department on 02/24/2021 for the symptoms described in the history of present illness. He was evaluated in the context of the global COVID-19 pandemic, which necessitated consideration that the patient might be at risk for infection with the SARS-CoV-2 virus that causes COVID-19. Institutional protocols and algorithms that pertain to the evaluation of patients at risk for COVID-19 are in a state of rapid change based on information released by regulatory bodies including the CDC and federal and state organizations. These policies and algorithms were followed during the patient's care in the ED.  Reduction of dislocation Date/Time: 10:34 PM Performed by: Minna Antis Authorized by: Minna Antis Consent: Verbal consent obtained. Risks and benefits: risks, benefits and alternatives were discussed Consent given by: patient Required items: required blood products, implants, devices, and special equipment available Time out: Immediately prior to procedure a "time out" was called to verify the correct  patient, procedure, equipment, support staff and site/side marked as required.  Patient sedated: No  Vitals: Vital signs were monitored during sedation. Patient tolerance: Patient tolerated the procedure well with no immediate complications. Joint: Left fifth finger PIP Reduction technique: Traction   ____________________________________________   FINAL CLINICAL IMPRESSION(S) / ED DIAGNOSES  Fall Left humerus fracture Left fifth finger dislocation   Minna Antis, MD 02/24/21 2235

## 2021-02-24 NOTE — ED Triage Notes (Signed)
Pt to ED via EMS for witnessed fall, reports he was walking up the steps when he tripped, landing on his left shoulder C/o left shoulder pain - left elbow skin tear wrapped by fire on scene. Pt denies other injuries.   Denies head injury, LOC, neck pain, dizziness, or blood thinners. Arrives in c-collar placed by EMS.

## 2021-02-24 NOTE — ED Notes (Signed)
Dr. Paduchowski at bedside.  

## 2021-02-24 NOTE — Discharge Instructions (Signed)
Please call the number provided for orthopedics to arrange a follow-up appointment in 1 week for recheck/reevaluation.  Please wear your sling.  Please take your pain medication as needed but only as prescribed.  Return to the emergency department for any worsening pain, or any other symptom personally concerning to yourself.

## 2021-12-29 ENCOUNTER — Encounter: Payer: Self-pay | Admitting: Intensive Care

## 2021-12-29 ENCOUNTER — Emergency Department
Admission: EM | Admit: 2021-12-29 | Discharge: 2021-12-29 | Disposition: A | Discharge status: Home / Self Care | Payer: Medicare PPO | Source: Home / Self Care | Attending: Emergency Medicine | Admitting: Emergency Medicine

## 2021-12-29 ENCOUNTER — Other Ambulatory Visit: Payer: Self-pay

## 2021-12-29 DIAGNOSIS — K668 Other specified disorders of peritoneum: Secondary | ICD-10-CM | POA: Diagnosis not present

## 2021-12-29 DIAGNOSIS — K9429 Other complications of gastrostomy: Secondary | ICD-10-CM | POA: Insufficient documentation

## 2021-12-29 DIAGNOSIS — J449 Chronic obstructive pulmonary disease, unspecified: Secondary | ICD-10-CM | POA: Insufficient documentation

## 2021-12-29 DIAGNOSIS — E119 Type 2 diabetes mellitus without complications: Secondary | ICD-10-CM | POA: Insufficient documentation

## 2021-12-29 DIAGNOSIS — T85528A Displacement of other gastrointestinal prosthetic devices, implants and grafts, initial encounter: Secondary | ICD-10-CM

## 2021-12-29 NOTE — ED Notes (Signed)
Pt discharge papers placed in medical records ?

## 2021-12-29 NOTE — ED Triage Notes (Signed)
Patient presents to ER after pulling feeding tube out around 6:00pm. Denies any other complaints ?

## 2021-12-29 NOTE — ED Notes (Signed)
Pt son to pickup pt, Susa Raring notified of pt situation. Pt son called back to reassure to come in lobby to get pt. ?

## 2021-12-29 NOTE — ED Provider Notes (Signed)
? ?  [facility] ?Provider Note ? ? ? Event Date/Time  ? First MD Initiated Contact with Patient 12/29/21 1926   ?  (approximate) ? ?History  ? ?Chief Complaint: Dislodged G-tube. ?HPI ? ?Mike Parks is a 76 y.o. male with a past medical history of multiple sclerosis, diabetes, COPD, presents emergency department for dislodged G-tube.  Patient states he slipped today and his G-tube got stuck causing it to pull out.  Patient uses the G-tube for feeding and medications so he came to the emergency department.  Patient has no other complaints.  Denies any abdominal pain. ? ?Physical Exam  ? ?Triage Vital Signs: ?ED Triage Vitals  ?Enc Vitals Group  ?   BP 12/29/21 1950 101/76  ?   Pulse Rate 12/29/21 1950 86  ?   Resp 12/29/21 1950 20  ?   Temp 12/29/21 1822 98.6 ?F (37 ?C)  ?   Temp Source 12/29/21 1822 Oral  ?   SpO2 12/29/21 1950 100 %  ?   Weight 12/29/21 1822 120 lb (54.4 kg)  ?   Height 12/29/21 1822 5\' 9"  (1.753 m)  ?   Head Circumference --   ?   Peak Flow --   ?   Pain Score 12/29/21 1822 0  ?   Pain Loc --   ?   Pain Edu? --   ?   Excl. in GC? --   ? ? ?Most recent vital signs: ?Vitals:  ? 12/29/21 1822 12/29/21 1950  ?BP:  101/76  ?Pulse:  86  ?Resp:  20  ?Temp: 98.6 ?F (37 ?C)   ?SpO2:  100%  ? ? ?General: Awake, no distress.  ?CV:  Good peripheral perfusion.  Regular rate and rhythm  ?Resp:  Normal effort.  Equal breath sounds bilaterally.  ?Abd:  No distention.  Soft, nontender.  No rebound or guarding.  Patient has G-tube insertion site present, well-appearing.  Patient states it has been in for over 1 year. ? ? ? ?ED Results / Procedures / Treatments  ? ? ?MEDICATIONS ORDERED IN ED: ?Medications - No data to display ? ? ?IMPRESSION / MDM / ASSESSMENT AND PLAN / ED COURSE  ?I reviewed the triage vital signs and the nursing notes. ? ?Patient presents to the emergency department for dislodged G-tube.  States the G-tube has been in place greater than 1 year.  G-tube should have a  good tract.  I was able to replace the G-tube.  Was able to auscultate with air seems to be appropriately placed, pain-free.  Flushed with saline with no discomfort.  We will discharge the patient home.  Patient has no complaints. ? ?FINAL CLINICAL IMPRESSION(S) / ED DIAGNOSES  ? ?Dislodged gastrostomy tube ? ?Note:  This document was prepared using Dragon voice recognition software and may include unintentional dictation errors. ?  ?12/31/21, MD ?12/29/21 1959 ? ?

## 2021-12-29 NOTE — ED Notes (Signed)
See triage note for additional details, pt to ED for feeding tube dislodgement that was accidentally pulled out last night. Pt denies any pain. Pt is A&Ox4.  ?

## 2021-12-31 ENCOUNTER — Inpatient Hospital Stay: Payer: Medicare PPO | Admitting: Certified Registered"

## 2021-12-31 ENCOUNTER — Encounter
Admission: EM | Disposition: A | Discharge status: Hospice | Payer: Self-pay | Source: Home / Self Care | Attending: Internal Medicine

## 2021-12-31 ENCOUNTER — Emergency Department: Payer: Medicare PPO

## 2021-12-31 ENCOUNTER — Inpatient Hospital Stay
Admission: EM | Admit: 2021-12-31 | Discharge: 2022-01-15 | DRG: 326 | Disposition: A | Discharge status: Hospice | Payer: Medicare PPO | Attending: Student | Admitting: Student

## 2021-12-31 DIAGNOSIS — Z515 Encounter for palliative care: Secondary | ICD-10-CM

## 2021-12-31 DIAGNOSIS — A419 Sepsis, unspecified organism: Secondary | ICD-10-CM | POA: Diagnosis not present

## 2021-12-31 DIAGNOSIS — R197 Diarrhea, unspecified: Secondary | ICD-10-CM | POA: Diagnosis not present

## 2021-12-31 DIAGNOSIS — Y838 Other surgical procedures as the cause of abnormal reaction of the patient, or of later complication, without mention of misadventure at the time of the procedure: Secondary | ICD-10-CM | POA: Diagnosis present

## 2021-12-31 DIAGNOSIS — K659 Peritonitis, unspecified: Secondary | ICD-10-CM | POA: Diagnosis not present

## 2021-12-31 DIAGNOSIS — L8990 Pressure ulcer of unspecified site, unspecified stage: Secondary | ICD-10-CM | POA: Diagnosis not present

## 2021-12-31 DIAGNOSIS — R6521 Severe sepsis with septic shock: Secondary | ICD-10-CM | POA: Diagnosis not present

## 2021-12-31 DIAGNOSIS — Z66 Do not resuscitate: Secondary | ICD-10-CM | POA: Diagnosis not present

## 2021-12-31 DIAGNOSIS — Z681 Body mass index (BMI) 19 or less, adult: Secondary | ICD-10-CM | POA: Diagnosis not present

## 2021-12-31 DIAGNOSIS — E039 Hypothyroidism, unspecified: Secondary | ICD-10-CM | POA: Diagnosis present

## 2021-12-31 DIAGNOSIS — K920 Hematemesis: Secondary | ICD-10-CM | POA: Diagnosis not present

## 2021-12-31 DIAGNOSIS — R64 Cachexia: Secondary | ICD-10-CM | POA: Diagnosis present

## 2021-12-31 DIAGNOSIS — K9429 Other complications of gastrostomy: Principal | ICD-10-CM | POA: Diagnosis present

## 2021-12-31 DIAGNOSIS — J440 Chronic obstructive pulmonary disease with acute lower respiratory infection: Secondary | ICD-10-CM | POA: Diagnosis not present

## 2021-12-31 DIAGNOSIS — L24A2 Irritant contact dermatitis due to fecal, urinary or dual incontinence: Secondary | ICD-10-CM | POA: Diagnosis not present

## 2021-12-31 DIAGNOSIS — Z79899 Other long term (current) drug therapy: Secondary | ICD-10-CM

## 2021-12-31 DIAGNOSIS — I480 Paroxysmal atrial fibrillation: Secondary | ICD-10-CM | POA: Diagnosis present

## 2021-12-31 DIAGNOSIS — E871 Hypo-osmolality and hyponatremia: Secondary | ICD-10-CM | POA: Diagnosis not present

## 2021-12-31 DIAGNOSIS — Z87891 Personal history of nicotine dependence: Secondary | ICD-10-CM

## 2021-12-31 DIAGNOSIS — G35 Multiple sclerosis: Secondary | ICD-10-CM | POA: Diagnosis present

## 2021-12-31 DIAGNOSIS — Z20822 Contact with and (suspected) exposure to covid-19: Secondary | ICD-10-CM | POA: Diagnosis present

## 2021-12-31 DIAGNOSIS — T85528A Displacement of other gastrointestinal prosthetic devices, implants and grafts, initial encounter: Secondary | ICD-10-CM

## 2021-12-31 DIAGNOSIS — K219 Gastro-esophageal reflux disease without esophagitis: Secondary | ICD-10-CM | POA: Diagnosis present

## 2021-12-31 DIAGNOSIS — J189 Pneumonia, unspecified organism: Secondary | ICD-10-CM | POA: Diagnosis not present

## 2021-12-31 DIAGNOSIS — E119 Type 2 diabetes mellitus without complications: Secondary | ICD-10-CM | POA: Diagnosis present

## 2021-12-31 DIAGNOSIS — N3281 Overactive bladder: Secondary | ICD-10-CM | POA: Diagnosis present

## 2021-12-31 DIAGNOSIS — I959 Hypotension, unspecified: Secondary | ICD-10-CM

## 2021-12-31 DIAGNOSIS — N179 Acute kidney failure, unspecified: Secondary | ICD-10-CM | POA: Diagnosis not present

## 2021-12-31 DIAGNOSIS — E440 Moderate protein-calorie malnutrition: Secondary | ICD-10-CM | POA: Diagnosis not present

## 2021-12-31 DIAGNOSIS — K668 Other specified disorders of peritoneum: Secondary | ICD-10-CM | POA: Diagnosis present

## 2021-12-31 DIAGNOSIS — J9601 Acute respiratory failure with hypoxia: Secondary | ICD-10-CM | POA: Diagnosis not present

## 2021-12-31 DIAGNOSIS — E878 Other disorders of electrolyte and fluid balance, not elsewhere classified: Secondary | ICD-10-CM | POA: Diagnosis not present

## 2021-12-31 DIAGNOSIS — Z22322 Carrier or suspected carrier of Methicillin resistant Staphylococcus aureus: Secondary | ICD-10-CM

## 2021-12-31 DIAGNOSIS — J449 Chronic obstructive pulmonary disease, unspecified: Secondary | ICD-10-CM | POA: Diagnosis present

## 2021-12-31 DIAGNOSIS — Z7189 Other specified counseling: Secondary | ICD-10-CM | POA: Diagnosis not present

## 2021-12-31 HISTORY — PX: LAPAROTOMY: SHX154

## 2021-12-31 LAB — COMPREHENSIVE METABOLIC PANEL
ALT: 16 U/L (ref 0–44)
AST: 48 U/L — ABNORMAL HIGH (ref 15–41)
Albumin: 3.6 g/dL (ref 3.5–5.0)
Alkaline Phosphatase: 89 U/L (ref 38–126)
Anion gap: 18 — ABNORMAL HIGH (ref 5–15)
BUN: 33 mg/dL — ABNORMAL HIGH (ref 8–23)
CO2: 26 mmol/L (ref 22–32)
Calcium: 9.7 mg/dL (ref 8.9–10.3)
Chloride: 88 mmol/L — ABNORMAL LOW (ref 98–111)
Creatinine, Ser: 1.79 mg/dL — ABNORMAL HIGH (ref 0.61–1.24)
GFR, Estimated: 39 mL/min — ABNORMAL LOW (ref >60)
Glucose, Bld: 166 mg/dL — ABNORMAL HIGH (ref 70–99)
Potassium: 3.5 mmol/L (ref 3.5–5.1)
Sodium: 132 mmol/L — ABNORMAL LOW (ref 135–145)
Total Bilirubin: 1.6 mg/dL — ABNORMAL HIGH (ref 0.3–1.2)
Total Protein: 7.5 g/dL (ref 6.5–8.1)

## 2021-12-31 LAB — CBC
HCT: 43.2 % (ref 39.0–52.0)
Hemoglobin: 14.4 g/dL (ref 13.0–17.0)
MCH: 30.1 pg (ref 26.0–34.0)
MCHC: 33.3 g/dL (ref 30.0–36.0)
MCV: 90.2 fL (ref 80.0–100.0)
Platelets: 319 10*3/uL (ref 150–400)
RBC: 4.79 MIL/uL (ref 4.22–5.81)
RDW: 14.1 % (ref 11.5–15.5)
WBC: 11.3 10*3/uL — ABNORMAL HIGH (ref 4.0–10.5)
nRBC: 0 % (ref 0.0–0.2)

## 2021-12-31 LAB — RESP PANEL BY RT-PCR (FLU A&B, COVID) ARPGX2
Influenza A by PCR: NEGATIVE
Influenza B by PCR: NEGATIVE
SARS Coronavirus 2 by RT PCR: NEGATIVE

## 2021-12-31 LAB — TYPE AND SCREEN
ABO/RH(D): A POS
Antibody Screen: NEGATIVE

## 2021-12-31 LAB — TROPONIN I (HIGH SENSITIVITY): Troponin I (High Sensitivity): 9 ng/L (ref <18)

## 2021-12-31 LAB — PROTIME-INR
INR: 1.1 (ref 0.8–1.2)
Prothrombin Time: 14.3 seconds (ref 11.4–15.2)

## 2021-12-31 LAB — LACTIC ACID, PLASMA
Lactic Acid, Venous: 5.8 mmol/L (ref 0.5–1.9)
Lactic Acid, Venous: 4.8 mmol/L (ref 0.5–1.9)

## 2021-12-31 SURGERY — LAPAROTOMY, EXPLORATORY
Anesthesia: General | Site: Abdomen

## 2021-12-31 MED ORDER — SODIUM CHLORIDE 0.9 % IV SOLN: INTRAVENOUS | Status: DC | PRN

## 2021-12-31 MED ORDER — NOREPINEPHRINE 4 MG/250ML-% IV SOLN
2.0000 ug/min | INTRAVENOUS | Status: DC
  Administered 2021-12-31: 2 ug/min via INTRAVENOUS
  Filled 2021-12-31: qty 250

## 2021-12-31 MED ORDER — ROCURONIUM BROMIDE 100 MG/10ML IV SOLN
INTRAVENOUS | Status: DC | PRN
  Administered 2021-12-31 (×2): 20 mg via INTRAVENOUS
  Administered 2022-01-01: 10 mg via INTRAVENOUS
  Administered 2022-01-01 (×2): 20 mg via INTRAVENOUS

## 2021-12-31 MED ORDER — ONDANSETRON 4 MG PO TBDP
4.0000 mg | ORAL_TABLET | Freq: Four times a day (QID) | ORAL | Status: DC | PRN
  Filled 2021-12-31: qty 1

## 2021-12-31 MED ORDER — BUPIVACAINE LIPOSOME 1.3 % IJ SUSP
INTRAMUSCULAR | Status: AC
  Filled 2021-12-31: qty 20

## 2021-12-31 MED ORDER — FENTANYL CITRATE (PF) 100 MCG/2ML IJ SOLN
INTRAMUSCULAR | Status: AC
  Filled 2021-12-31: qty 2

## 2021-12-31 MED ORDER — SODIUM CHLORIDE 0.9 % IV SOLN
2.0000 g | Freq: Once | INTRAVENOUS | Status: AC
  Administered 2021-12-31: 2 g via INTRAVENOUS
  Filled 2021-12-31: qty 2

## 2021-12-31 MED ORDER — LACTATED RINGERS IV SOLN
INTRAVENOUS | Status: AC
Start: 2021-12-31 — End: 2022-01-01

## 2021-12-31 MED ORDER — HYDROMORPHONE HCL 1 MG/ML IJ SOLN
0.5000 mg | INTRAMUSCULAR | Status: DC | PRN
  Administered 2022-01-01 – 2022-01-09 (×6): 0.5 mg via INTRAVENOUS
  Filled 2021-12-31 (×6): qty 1

## 2021-12-31 MED ORDER — IOHEXOL 300 MG/ML  SOLN
75.0000 mL | Freq: Once | INTRAMUSCULAR | Status: AC | PRN
  Administered 2021-12-31: 75 mL via INTRAVENOUS

## 2021-12-31 MED ORDER — ONDANSETRON HCL 4 MG/2ML IJ SOLN
INTRAMUSCULAR | Status: AC
  Filled 2021-12-31: qty 2

## 2021-12-31 MED ORDER — DEXAMETHASONE SODIUM PHOSPHATE 10 MG/ML IJ SOLN
INTRAMUSCULAR | Status: DC | PRN
  Administered 2021-12-31: 4 mg via INTRAVENOUS

## 2021-12-31 MED ORDER — SUCCINYLCHOLINE CHLORIDE 200 MG/10ML IV SOSY
PREFILLED_SYRINGE | INTRAVENOUS | Status: DC | PRN
  Administered 2021-12-31: 70 mg via INTRAVENOUS

## 2021-12-31 MED ORDER — SODIUM CHLORIDE 0.9 % IV BOLUS (SEPSIS)
250.0000 mL | Freq: Once | INTRAVENOUS | Status: AC
  Administered 2021-12-31: 250 mL via INTRAVENOUS

## 2021-12-31 MED ORDER — SUCCINYLCHOLINE CHLORIDE 200 MG/10ML IV SOSY
PREFILLED_SYRINGE | INTRAVENOUS | Status: AC
  Filled 2021-12-31: qty 10

## 2021-12-31 MED ORDER — METRONIDAZOLE 500 MG/100ML IV SOLN
500.0000 mg | Freq: Once | INTRAVENOUS | Status: AC
  Administered 2021-12-31: 500 mg via INTRAVENOUS
  Filled 2021-12-31: qty 100

## 2021-12-31 MED ORDER — POLYETHYLENE GLYCOL 3350 17 G PO PACK: 17.0000 g | PACK | Freq: Every day | ORAL | Status: DC | PRN

## 2021-12-31 MED ORDER — DEXAMETHASONE SODIUM PHOSPHATE 10 MG/ML IJ SOLN
INTRAMUSCULAR | Status: AC
  Filled 2021-12-31: qty 1

## 2021-12-31 MED ORDER — FENTANYL CITRATE (PF) 100 MCG/2ML IJ SOLN
INTRAMUSCULAR | Status: DC | PRN
  Administered 2021-12-31 – 2022-01-01 (×4): 50 ug via INTRAVENOUS

## 2021-12-31 MED ORDER — PIPERACILLIN-TAZOBACTAM 3.375 G IVPB
3.3750 g | Freq: Three times a day (TID) | INTRAVENOUS | Status: AC
  Administered 2022-01-01 – 2022-01-07 (×21): 3.375 g via INTRAVENOUS
  Filled 2021-12-31 (×21): qty 50

## 2021-12-31 MED ORDER — LACTATED RINGERS IV BOLUS: 1000.0000 mL | Freq: Once | INTRAVENOUS | Status: DC

## 2021-12-31 MED ORDER — LIDOCAINE HCL (PF) 2 % IJ SOLN
INTRAMUSCULAR | Status: AC
  Filled 2021-12-31: qty 5

## 2021-12-31 MED ORDER — PROPOFOL 10 MG/ML IV BOLUS
INTRAVENOUS | Status: DC | PRN
Start: 2021-12-31 — End: 2022-01-01
  Administered 2021-12-31: 60 mg via INTRAVENOUS

## 2021-12-31 MED ORDER — SODIUM CHLORIDE FLUSH 0.9 % IV SOLN
INTRAVENOUS | Status: AC
  Filled 2021-12-31: qty 10

## 2021-12-31 MED ORDER — PANTOPRAZOLE SODIUM 40 MG IV SOLR
40.0000 mg | Freq: Once | INTRAVENOUS | Status: AC
  Administered 2021-12-31: 40 mg via INTRAVENOUS
  Filled 2021-12-31: qty 10

## 2021-12-31 MED ORDER — SODIUM CHLORIDE 0.9 % IV BOLUS (SEPSIS)
500.0000 mL | Freq: Once | INTRAVENOUS | Status: AC
  Administered 2021-12-31: 500 mL via INTRAVENOUS

## 2021-12-31 MED ORDER — ROCURONIUM BROMIDE 10 MG/ML (PF) SYRINGE
PREFILLED_SYRINGE | INTRAVENOUS | Status: AC
  Filled 2021-12-31: qty 10

## 2021-12-31 MED ORDER — ONDANSETRON HCL 4 MG/2ML IJ SOLN
4.0000 mg | Freq: Four times a day (QID) | INTRAMUSCULAR | Status: DC | PRN
  Filled 2021-12-31: qty 2

## 2021-12-31 MED ORDER — PROPOFOL 10 MG/ML IV BOLUS
INTRAVENOUS | Status: AC
  Filled 2021-12-31: qty 20

## 2021-12-31 MED ORDER — SODIUM CHLORIDE 0.9 % IV BOLUS (SEPSIS)
1000.0000 mL | Freq: Once | INTRAVENOUS | Status: AC
  Administered 2021-12-31: 1000 mL via INTRAVENOUS

## 2021-12-31 MED ORDER — LIDOCAINE HCL (CARDIAC) PF 100 MG/5ML IV SOSY
PREFILLED_SYRINGE | INTRAVENOUS | Status: DC | PRN
  Administered 2021-12-31: 50 mg via INTRAVENOUS

## 2021-12-31 MED ORDER — PANTOPRAZOLE SODIUM 40 MG IV SOLR: 40.0000 mg | Freq: Every day | INTRAVENOUS | Status: DC

## 2021-12-31 MED ORDER — BUPIVACAINE-EPINEPHRINE (PF) 0.25% -1:200000 IJ SOLN
INTRAMUSCULAR | Status: AC
  Filled 2021-12-31: qty 30

## 2021-12-31 SURGICAL SUPPLY — 72 items
2-0 silk ×2 IMPLANT
APL PRP STRL LF DISP 70% ISPRP (MISCELLANEOUS) ×2
BRR ADH 6X5 SEPRAFILM 1 SHT (MISCELLANEOUS)
BULB RESERV EVAC DRAIN JP 100C (MISCELLANEOUS) ×4 IMPLANT
CHLORAPREP W/TINT 26 (MISCELLANEOUS) ×3 IMPLANT
DRAIN CHANNEL JP 19F (MISCELLANEOUS) ×4 IMPLANT
DRAIN PENROSE 12X.25 LTX STRL (MISCELLANEOUS) ×2 IMPLANT
DRAPE LAPAROTOMY 100X77 ABD (DRAPES) ×3 IMPLANT
DRAPE LEGGINS SURG 28X43 STRL (DRAPES) IMPLANT
DRAPE UNDER BUTTOCK W/FLU (DRAPES) ×3 IMPLANT
DRSG OPSITE POSTOP 4X10 (GAUZE/BANDAGES/DRESSINGS) ×2 IMPLANT
DRSG OPSITE POSTOP 4X12 (GAUZE/BANDAGES/DRESSINGS) IMPLANT
DRSG TEGADERM 4X10 (GAUZE/BANDAGES/DRESSINGS) IMPLANT
DRSG TEGADERM 4X4.75 (GAUZE/BANDAGES/DRESSINGS) ×2 IMPLANT
ELECT BLADE 6.5 EXT (BLADE) ×3 IMPLANT
ELECT CAUTERY BLADE TIP 2.5 (TIP) ×3
ELECT REM PT RETURN 9FT ADLT (ELECTROSURGICAL) ×3
ELECTRODE CAUTERY BLDE TIP 2.5 (TIP) ×2 IMPLANT
ELECTRODE REM PT RTRN 9FT ADLT (ELECTROSURGICAL) ×2 IMPLANT
GAUZE 4X4 16PLY ~~LOC~~+RFID DBL (SPONGE) ×3 IMPLANT
GAUZE SPONGE 4X4 12PLY STRL (GAUZE/BANDAGES/DRESSINGS) ×3 IMPLANT
GLOVE SURG SYN 7.0 (GLOVE) ×6 IMPLANT
GLOVE SURG SYN 7.0 PF PI (GLOVE) ×2 IMPLANT
GLOVE SURG SYN 7.5  E (GLOVE) ×6
GLOVE SURG SYN 7.5 E (GLOVE) ×4 IMPLANT
GLOVE SURG SYN 7.5 PF PI (GLOVE) ×2 IMPLANT
GOWN STRL REUS W/ TWL LRG LVL3 (GOWN DISPOSABLE) ×6 IMPLANT
GOWN STRL REUS W/TWL LRG LVL3 (GOWN DISPOSABLE) ×6
KIT TURNOVER KIT A (KITS) ×3 IMPLANT
Kangaroo Gastrostomy Feeding Tube IMPLANT
LABEL OR SOLS (LABEL) ×3 IMPLANT
LIGASURE IMPACT 36 18CM CVD LR (INSTRUMENTS) ×2 IMPLANT
MANIFOLD NEPTUNE II (INSTRUMENTS) ×3 IMPLANT
NEEDLE HYPO 22GX1.5 SAFETY (NEEDLE) ×1 IMPLANT
NS IRRIG 1000ML POUR BTL (IV SOLUTION) ×5 IMPLANT
NS IRRIG 500ML POUR BTL (IV SOLUTION) ×2 IMPLANT
PACK BASIN MAJOR ARMC (MISCELLANEOUS) ×3 IMPLANT
PACK COLON CLEAN CLOSURE (MISCELLANEOUS) ×3 IMPLANT
RELOAD PROXIMATE TA60MM BLUE (ENDOMECHANICALS) ×3 IMPLANT
RELOAD STAPLE 60 BLU REG PROX (ENDOMECHANICALS) IMPLANT
SEPRAFILM MEMBRANE 5X6 (MISCELLANEOUS) IMPLANT
SLEEVE SCD COMPRESS KNEE MED (STOCKING) ×2 IMPLANT
SPONGE T-LAP 18X18 ~~LOC~~+RFID (SPONGE) ×12 IMPLANT
STAPLER CIRCULAR MANUAL XL 29 (STAPLE) IMPLANT
STAPLER CVD CUT BL 40 RELOAD (ENDOMECHANICALS) IMPLANT
STAPLER CVD CUT BLU 40 RELOAD (ENDOMECHANICALS) IMPLANT
STAPLER GUN LINEAR PROX 60 (STAPLE) ×2 IMPLANT
STAPLER PROXIMATE 75MM BLUE (STAPLE) IMPLANT
STAPLER RELOADABLE 65 2-0 SUT (MISCELLANEOUS) IMPLANT
STAPLER SKIN PROX 35W (STAPLE) ×3 IMPLANT
STAPLER SYS INTERNAL RELOAD SS (MISCELLANEOUS) IMPLANT
SUT ETHILON 3-0 (SUTURE) ×6 IMPLANT
SUT MNCRL 3-0 UNDYED SH (SUTURE) ×2 IMPLANT
SUT MONOCRYL 3-0 UNDYED (SUTURE) ×3
SUT PDS AB 1 CT1 27 (SUTURE) ×6 IMPLANT
SUT PDS AB 1 CT1 36 (SUTURE) ×3 IMPLANT
SUT PROLENE 2 0 SH DA (SUTURE) IMPLANT
SUT SILK 2 0 (SUTURE) ×3
SUT SILK 2 0SH CR/8 30 (SUTURE) ×2 IMPLANT
SUT SILK 2-0 (SUTURE) ×3 IMPLANT
SUT SILK 2-0 18XBRD TIE 12 (SUTURE) ×2 IMPLANT
SUT SILK 3-0 (SUTURE) ×5 IMPLANT
SUT VIC AB 1 CTX 27 (SUTURE) ×3 IMPLANT
SUT VIC AB 3-0 SH 27 (SUTURE) ×3
SUT VIC AB 3-0 SH 27X BRD (SUTURE) ×2 IMPLANT
SUT VICRYL 0 AB UR-6 (SUTURE) ×2 IMPLANT
SYR 10ML LL (SYRINGE) ×5 IMPLANT
SYR 20ML LL LF (SYRINGE) ×2 IMPLANT
TRAY FOLEY MTR SLVR 16FR STAT (SET/KITS/TRAYS/PACK) ×3 IMPLANT
TUBE JEJUNO 22X14 (TUBING) ×2 IMPLANT
WATER STERILE IRR 500ML POUR (IV SOLUTION) ×3 IMPLANT
jp drain 19fr ×4 IMPLANT

## 2021-12-31 NOTE — ED Notes (Signed)
Pt has 1 episode  ?

## 2021-12-31 NOTE — ED Notes (Signed)
Report given to Selena Batten, [place] RN  ?

## 2021-12-31 NOTE — Consult Note (Addendum)
? ?NAME:  Mike Parks, MRN:  [id number], DOB:  July 29, 1946, LOS: 0 ?ADMISSION DATE:  12/31/2021, CONSULTATION DATE:  01/01/2022 ?REFERRING MD:  Dr. [name], CHIEF COMPLAINT:  Abdominal pain  ? ?History of Present Illness:  ?76 yo M presenting to [name] ED from home with complaints of abdominal pain in the setting of a suspected clogged PEG tube and coffee ground emesis since Thurs night 12/30/21. Patient had come to the ED on 12/29/21 because his PEG tube had become dislodged. Per chart review it appears as though the PEG tube was replaced bedside, no confirmatory imaging noted. Per Dr. Adelene Idler documentation & via telephone discussion with the patient's son, abdominal pain that began the evening of 12/29/21. The patient's son reported this abdominal pain worsened on 12/30/21 and then the patient began having coffee-ground emesis on the morning of 12/31/21. Per son, the patient has been able to take all his medications as prescribed. ? ?ED course: ?Upon arrival the patient was hypotensive & tachycardic, spitting up foamy phlegm with coffee-ground material. CT imaging shows malposition of the PEG tube in the peritoneal cavity, not inside the intestine/stomach. Surgery was contacted for admission and emergent intervention. ?Medications given: levophed initiated, cefepime/flagyl, IV contrast, protonix, 1.75 mL IVF bolus ?Initial Vitals: 98.2, RR 20, HR 107, BP-79/60 & SpO2 97% on room air ?Significant labs: (Labs/ Imaging personally reviewed) ?I, Cheryll Cockayne Rust-Chester, AGACNP-BC, personally viewed and interpreted this ECG. ?EKG Interpretation: Date: 12/31/21, EKG Time: 16:30, Rate: 117,  ?Rhythm: ST, QRS Axis:  possible LAD, Intervals: normal, ST/T Wave abnormalities: none, Narrative Interpretation: Sinus Tachycardia ?Chemistry: Na+: 132, K+: 3.5, BUN/Cr.: 33/1.79, Serum CO2/ AG: 26/18 ?Hematology: WBC: 11.3, Hgb: 14.4,  ?Troponin: 9, Lactic/ PCT: 5.8 > 4.8, COVID-19 & Influenza A/B: negative ? ?CT 12/31/21:  Malpositioned/dislodged intraperitoneal gastrostomy tube. ?Query transverse colon diverticulitis just posterior to the ?malpositioned gastrostomy tube. Small to moderate volume pneumoperitoneum and free fluid measuring slightly higher than simple fluid. Findings likely due to a malpositioned/dislodged intraperitoneal gastrostomy tube. Less likely complicated/perforated mid transverse colon diverticulitis. Moderate volume hiatal hernia with esophageal lumen dilated with fluid. Constipation. Age-indeterminate L2 incomplete burst fracture with at least 20% height loss and 2 mm retropulsion into the central canal. Correlate with point tenderness to palpation to evaluate for an acute component. Right base consolidation likely represents atelectasis. Given slight irregularity and nodularity, consider follow-up CT in 3 months to evaluate for resolution. 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 Finding likely sequelae of prior granulomatous disease. 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 ? ?General surgeon, Dr. [name], discussed options with son and patient bedside. Decision was made for aggressive measures including exploratory laparotomy to wash out his abdomen, find any potential source of perforation & repair and replace feeding tube.  ? ?Intra-operatively gastrostomy tube removed, ex-lap performed with new gastrostomy tube placement and repair of gastrocolonic fistula. ?PCCM consulted for additional management and ICU admission post-op in the setting of peritonitis and possible perforation. ? ?Pertinent  Medical History  ?Multiple Sclerosis ?Dysphagia s/p PEG tube placement (11/2020) ?T2DM ?Barrett Esophagus ?COPD ?Acquired hypothyroidism ?GERD ?Overactive bladder ? ?Significant Hospital Events: ?Including procedures, antibiotic start and stop dates in addition to other pertinent  events   ?12/31/21: Patient taken emergently for ex-lap to wash out abdomen, find any potential source of perforation/repair and replace PEG tube. Patient admitted to ICU post-op, off pressors on 2 L [place]. ? ?Interim History /  Subjective:  ?Patient drowsy but responsive, no complaints at this time. ? ?Objective   ?Blood pressure 108/71, pulse 100, temperature 98.2 ?F (36.8 ?C), temperature source Oral, resp. rate (!) 21, SpO2 98 %. ?   ?   ?No intake or output data in the 24 hours ending 12/31/21 2215 ?There were no vitals filed for this visit. ? ?Examination: ?General: Adult male, acutely ill, lying in bed, NAD ?HEENT: MM pink/dry, anicteric, atraumatic, neck supple ?Neuro: A&O x 2, able to follow commands, PERRL +3, MAE- generalized weakness ?CV: s1s2 RRR, NSR on monitor, no r/m/g ?Pulm: Regular, non labored on 2 L [place], breath sounds clear-BUL & clear/diminished-BLL ?GI: soft, rounded with 2 JP drains/ PEG site/ midline incision covered with honeycomb, non tender, bs x 4 ?GU: foley in place with clear yellow urine ?Skin: scattered ecchymosis BUE ?Extremities: warm/dry, pulses + 2 R/P, no edema noted ? ?Resolved Hospital Problem list   ? ? ?Assessment & Plan:  ?Peritonitis secondary to malposition of PEG tube in peritoneal cavity ?Sepsis with septic shock due to peritonitis - shock resolved post op ?Coffee-ground Emesis ?Initial interventions/workup included: 1.75 L of NS/LR & Cefepime/ Metronidazole. Intra-operatively 3 additional Liters of IVF, EBL 50 mL ?- Supplemental oxygen as needed, to maintain SpO2 > 90% ?- f/u cultures, trend lactic/ PCT ?- Daily CBC, monitor WBC/ fever curve ?- IV antibiotics: zosyn  ?- IVF hydration as needed ?- continue Protonix BID ?- continue dilaudid PRN for severe pain ?- Continue vasopressors PRN to maintain MAP< 65, norepinephrine ? ?Atelectasis ?COPD without current exacerbation ?- aggressive pulmonary toileting: IS & flutter valve as able > if not consider CPT with bed ?- continuous  pulse oximetry monitoring ?- bronchodilators PRN ? ?Suspected Acute Kidney Injury in the setting of suspected hypovolemia and sepsis ?Baseline Cr: 1.03 (*in 2020-unable to see more recent labs) ?Cr on admission: 1.79 ?- Strict I/O's: alert provider if UOP < 0.5 mL/kg/hr ?- gentle IVF hydration  ?- Daily BMP, replace electrolytes PRN ?- Avoid nephrotoxic agents as able, ensure adequate renal perfusion ? ?Multiple Sclerosis without current exacerbation ?- restart outpatient regimen once able to take PO medications per surgery: Dimethyl fumarate 240 mg once every M/ W/ F ? ?Acquired Hypothyroidism ?- hold home synthroid while NPO, last dose 3/17 > transition to IV synthroid if still NPO on 01/03/22 ? ?Mild Transaminitis in the setting of peritonitis, dehydration & sepsis ?PMHx: HLD ?- hold Lipitor due to elevated AST, re-evaluate once able to take meds via PEG tube ?- Trend hepatic function ?- avoid hepatotoxic agents ? ?Type 2 Diabetes Mellitus ?- Monitor CBG Q 4 hours ?- SSI sensitive dosing ?- target range while in ICU: 140-180 ?- follow ICU hyper/hypo-glycemia protocol ? ?Best Practice (right click and "Reselect all SmartList Selections" daily)  ?Diet/type: NPO ?DVT prophylaxis: SCD ?GI prophylaxis: PPI ?Lines: N/A ?Foley:  Yes, and it is still needed ?Code Status:  full code ?Last date of multidisciplinary goals of care discussion [12/31/21] ? ?Labs   ?CBC: ?Recent Labs  ?Lab 12/31/21 ?1630  ?WBC 11.3*  ?HGB 14.4  ?HCT 43.2  ?MCV 90.2  ?PLT 319  ? ? ?Basic Metabolic Panel: ?Recent Labs  ?Lab 12/31/21 ?1630  ?NA 132*  ?K 3.5  ?CL 88*  ?CO2 26  ?GLUCOSE 166*  ?BUN 33*  ?CREATININE 1.79*  ?CALCIUM 9.7  ? ?GFR: ?Estimated Creatinine Clearance: 27 mL/min (A) (by C-G formula based on SCr of 1.79 mg/dL (H)). ?Recent Labs  ?Lab 12/31/21 ?1630 12/31/21 ?1732 12/31/21 ?1856  ?  WBC 11.3*  --   --   ?LATICACIDVEN  --  5.8* 4.8*  ? ? ?Liver Function Tests: ?Recent Labs  ?Lab 12/31/21 ?1630  ?AST 48*  ?ALT 16  ?ALKPHOS 89   ?BILITOT 1.6*  ?PROT 7.5  ?ALBUMIN 3.6  ? ?No results for input(s): LIPASE, AMYLASE in the last 168 hours. ?No results for input(s): AMMONIA in the last 168 hours. ? ?ABG ?No results found for: PHART, PCO2ART, PO2ART, HCO3,

## 2021-12-31 NOTE — ED Notes (Signed)
Pt to ED via POV for feeding tube being clogged. Pt was seen two days ago for feeding tube being dislodged. Pt also having coffee ground emesis that started last night. Pt having multiple episodes of emesis with no relief. Pt has tenderness to abdomen.  ?

## 2021-12-31 NOTE — Consult Note (Signed)
PHARMACY -  BRIEF ANTIBIOTIC NOTE  ? ?Pharmacy has received consult(s) for Cefepime from an ED provider.  The patient's profile has been reviewed for ht/wt/allergies/indication/available labs.   ? ?One time order(s) placed for Cefepime 2gm ? ?Further antibiotics/pharmacy consults should be ordered by admitting physician if indicated.       ?                ?Thank you, ?Briceida Rasberry Rodriguez-Guzman PharmD, BCPS ?12/31/2021 5:30 PM ? ?

## 2021-12-31 NOTE — Progress Notes (Signed)
Pharmacy Antibiotic Note ? ?Mike Parks is a 76 y.o. male admitted on 12/31/2021 with  intra-abdominal infection .  Pharmacy has been consulted for Zosyn dosing. ? ?Plan: ?Metronidazole 500 mg IV X 1 given on 3/17 @ 1908. ?Cefepime 2 gm IV X 1 given on 3/17 @ 1739. ? ?Zosyn 3.375 gm IV Q8H EI ordered to continue on 3/18 @ 0500.  ? ?  ? ?Temp (24hrs), Avg:98.2 ?F (36.8 ?C), Min:98.2 ?F (36.8 ?C), Max:98.2 ?F (36.8 ?C) ? ?Recent Labs  ?Lab 12/31/21 ?1630 12/31/21 ?1732 12/31/21 ?1856  ?WBC 11.3*  --   --   ?CREATININE 1.79*  --   --   ?LATICACIDVEN  --  5.8* 4.8*  ?  ?Estimated Creatinine Clearance: 27 mL/min (A) (by C-G formula based on SCr of 1.79 mg/dL (H)).   ? ?No Known Allergies ? ?Antimicrobials this admission: ?  >>  ?  >>  ? ?Dose adjustments this admission: ? ? ?Microbiology results: ? BCx:  ? UCx  ? Sputum:   ? MRSA PCR:  ? ?Thank you for allowing pharmacy to be a part of this patient?s care. ? ?Zamiya Dillard D ?12/31/2021 9:44 PM ? ?

## 2021-12-31 NOTE — ED Provider Notes (Signed)
? ?[facility] ?Provider Note ? ? ? Event Date/Time  ? First MD Initiated Contact with Patient 12/31/21 1645   ?  (approximate) ? ? ?History  ? ?Hematemesis ? ? ?HPI ? ?MAUI BRITTEN is a 76 y.o. male with a history of COPD, multiple sclerosis, diabetes, Barrett esophagus who is brought to the ED due to vomiting today with coffee-ground material.  The patient was in his usual state of health reportedly until 2 days ago when his PEG tube inadvertently became dislodged.  He was seen in the ED where the tube was easily replaced.  Patient had no symptoms and exam was reassuring and he was discharged home. ? ?Today, patient describes generalized abdominal pain with the vomiting.  No fever. ?  ? ? ?Physical Exam  ? ?Triage Vital Signs: ?ED Triage Vitals [12/31/21 1632]  ?Enc Vitals Group  ?   BP (!) 79/60  ?   Pulse Rate (!) 111  ?   Resp 20  ?   Temp 98.2 ?F (36.8 ?C)  ?   Temp Source Oral  ?   SpO2 97 %  ?   Weight   ?   Height   ?   Head Circumference   ?   Peak Flow   ?   Pain Score   ?   Pain Loc   ?   Pain Edu?   ?   Excl. in GC?   ? ? ?Most recent vital signs: ?Vitals:  ? 12/31/21 2015 12/31/21 2100  ?BP: (!) 91/59 108/71  ?Pulse: (!) 101 100  ?Resp: 17 (!) 21  ?Temp:    ?SpO2: 96% 98%  ? ? ? ?General: Awake, ill-appearing.  ?CV:  Good peripheral perfusion.  Tachycardia heart rate 110 ?Resp:  Normal effort.  Clear to auscultation bilaterally ?Abd:  No distention.  Abdominal wall rigidity with generalized tenderness.  PEG tube protruding through the left upper quadrant abdominal wall.  There is brown mucoid fluid present at the ostomy site, likely gastric secretions. ?Other:  Moist oral mucosa. ? ? ?ED Results / Procedures / Treatments  ? ?Labs ?(all labs ordered are listed, but only abnormal results are displayed) ?Labs Reviewed  ?COMPREHENSIVE METABOLIC PANEL - Abnormal; Notable for the following components:  ?    Result Value  ? Sodium 132 (*)   ? Chloride 88 (*)   ? Glucose, Bld 166 (*)    ? BUN 33 (*)   ? Creatinine, Ser 1.79 (*)   ? AST 48 (*)   ? Total Bilirubin 1.6 (*)   ? GFR, Estimated 39 (*)   ? Anion gap 18 (*)   ? All other components within normal limits  ?CBC - Abnormal; Notable for the following components:  ? WBC 11.3 (*)   ? All other components within normal limits  ?LACTIC ACID, PLASMA - Abnormal; Notable for the following components:  ? Lactic Acid, Venous 4.8 (*)   ? All other components within normal limits  ?LACTIC ACID, PLASMA - Abnormal; Notable for the following components:  ? Lactic Acid, Venous 5.8 (*)   ? All other components within normal limits  ?RESP PANEL BY RT-PCR (FLU A&B, COVID) [id number]  ?CULTURE, BLOOD (ROUTINE X 2)  ?CULTURE, BLOOD (ROUTINE X 2)  ?URINE CULTURE  ?PROTIME-INR  ?URINALYSIS, COMPLETE (UACMP) WITH MICROSCOPIC  ?BASIC METABOLIC PANEL  ?MAGNESIUM  ?CBC  ?HEPATIC FUNCTION PANEL  ?PHOSPHORUS  ?POC OCCULT BLOOD, ED  ?TYPE AND SCREEN  ?TROPONIN I (HIGH  SENSITIVITY)  ? ? ? ?EKG ? ?Interpreted by me ?Sinus tachycardia rate 117.  Normal axis and intervals.  Normal QRS ST segments and T waves. ? ? ?RADIOLOGY ?CT abdomen pelvis shows intra-abdominal free air.  Radiology report reviewed.  Study discussed with the radiologist who notes likelihood that feeding tube is not positioned within the stomach or intestine, with associated adjacent colonic diverticulum which appears inflamed.  No SBO. ? ? ? ?PROCEDURES: ? ?Critical Care performed: Yes, see critical care procedure note(s) ? ?.Critical Care ?Performed by: Sharman Cheek, MD ?Authorized by: Sharman Cheek, MD  ? ?Critical care provider statement:  ?  Critical care time (minutes):  35 ?  Critical care time was exclusive of:  Separately billable procedures and treating other patients ?  Critical care was necessary to treat or prevent imminent or life-threatening deterioration of the following conditions:  Sepsis and shock ?  Critical care was time spent personally by me on the following activities:   Development of treatment plan with patient or surrogate, discussions with consultants, evaluation of patient's response to treatment, examination of patient, obtaining history from patient or surrogate, ordering and performing treatments and interventions, ordering and review of laboratory studies, ordering and review of radiographic studies, pulse oximetry, re-evaluation of patient's condition and review of old charts ?  Care discussed with: admitting provider   ?Comments:  ?    ? ? ?.1-3 Lead EKG Interpretation ?Performed by: Sharman Cheek, MD ?Authorized by: Sharman Cheek, MD  ? ?  Interpretation: abnormal   ?  ECG rate:  110 ?  ECG rate assessment: tachycardic   ?  Rhythm: sinus tachycardia   ?  Ectopy: none   ?  Conduction: normal   ? ? ?MEDICATIONS ORDERED IN ED: ?Medications  ?norepinephrine (LEVOPHED) 4mg  in (0.016 mg/mL) premix infusion (2 mcg/min Intravenous Rate/Dose Change 12/31/21 2345)  ?HYDROmorphone (DILAUDID) injection 0.5 mg ( Intravenous MAR Hold 12/31/21 2240)  ?polyethylene glycol (MIRALAX / GLYCOLAX) packet 17 g ( Oral MAR Hold 12/31/21 2240)  ?ondansetron (ZOFRAN-ODT) disintegrating tablet 4 mg ( Oral MAR Hold 12/31/21 2240)  ?  Or  ?ondansetron [facility]) injection 4 mg ( Intravenous MAR Hold 12/31/21 2240)  ?pantoprazole (PROTONIX) injection 40 mg ( Intravenous Automatically Held 01/08/22 2200)  ?lactated ringers infusion ( Intravenous New Bag/Given 12/31/21 2240)  ?piperacillin-tazobactam (ZOSYN) IVPB 3.375 g ( Intravenous Automatically Held 01/09/22 2100)  ?pantoprazole (PROTONIX) injection 40 mg (40 mg Intravenous Given 12/31/21 1700)  ?sodium chloride 0.9 % bolus 1,000 mL (0 mLs Intravenous Stopped 12/31/21 1809)  ?  And  ?sodium chloride 0.9 % bolus 500 mL (0 mLs Intravenous Stopped 12/31/21 1908)  ?  And  ?sodium chloride 0.9 % bolus 250 mL (0 mLs Intravenous Stopped 12/31/21 1908)  ?ceFEPIme (MAXIPIME) 2 g in sodium chloride 0.9 % 100 mL IVPB (0 g Intravenous Stopped 12/31/21 1809)   ?metroNIDAZOLE (FLAGYL) IVPB 500 mg (0 mg Intravenous Stopped 12/31/21 2021)  ?iohexol (OMNIPAQUE) 300 MG/ML solution 75 mL (75 mLs Intravenous Contrast Given 12/31/21 1809)  ? ? ? ?IMPRESSION / MDM / ASSESSMENT AND PLAN / ED COURSE  ?I reviewed the triage vital signs and the nursing notes. ?             ?               ? ?Differential diagnosis includes, but is not limited to, GI perforation, PEG tube malposition, small bowel obstruction, septic shock ? ?**The patient is on the cardiac monitor to evaluate  for evidence of arrhythmia and/or significant heart rate changes.**} ? ?Patient presents with concerning abdominal exam, tachycardia and hypotension.  Sepsis bundle initiated, Rocephin and Flagyl ordered for suspected intra-abdominal infection.  Will give IV fluids 30 mL/kg. ? ?Clinical Course as of 12/31/21 2353  ?Fri Dec 31, 2021  ?1948 After 30 mL/kg IV fluid bolus, lactate has improved from 5.5-4.8.  Blood pressure remains normal, but patient still tachycardic.  We will start Levophed to maximize tissue perfusion [PS]  ?2136 Family has arrived to bedside and been able to have a goals of care discussion with the patient.  They are in agreement at this time to proceed with emergency surgery and not palliative management.. [PS]  ?  ?Clinical Course User Index ?[PS] Sharman Cheek, MD  ? ? ? ?FINAL CLINICAL IMPRESSION(S) / ED DIAGNOSES  ? ?Final diagnoses:  ?Peritonitis (HCC)  ? ? ? ?Rx / DC Orders  ? ?ED Discharge Orders   ? ? None  ? ?  ? ? ? ?Note:  This document was prepared using Dragon voice recognition software and may include unintentional dictation errors. ?  ?Sharman Cheek, MD ?12/31/21 2354 ? ?

## 2021-12-31 NOTE — Anesthesia Preprocedure Evaluation (Signed)
Anesthesia Evaluation  ?Patient identified by MRN, date of birth, ID band ?Patient awake ? ?General Assessment Comment: ? ?Free air in abdomen. On norepinephrine ? ?Reviewed: ?Allergy & Precautions, NPO status , Patient's Chart, lab work & pertinent test results ? ?History of Anesthesia Complications ?Negative for: history of anesthetic complications ? ?Airway ?Mallampati: III ? ?TM Distance: >3 FB ?Neck ROM: Full ? ? ? Dental ? ?(+) Edentulous Upper, Edentulous Lower ?  ?Pulmonary ?neg sleep apnea, COPD, Patient abstained from smoking.Not current smoker, former smoker,  ?  ?Pulmonary exam normal ?breath sounds clear to auscultation ? ? ? ? ? ? Cardiovascular ?Exercise Tolerance: Good ?METS(-) hypertension(-) CAD and (-) Past MI negative cardio ROS ? ?(-) dysrhythmias  ?Rhythm:Regular Rate:Normal ?- Systolic murmurs ? ?  ?Neuro/Psych ?Son believes the multiple sclerosis is reasonably well controlled ? Neuromuscular disease negative psych ROS  ? GI/Hepatic ?neg GERD  ,(+)  ?  ? (-) substance abuse ? ,   ?Endo/Other  ?diabetesHypothyroidism  ? Renal/GU ?negative Renal ROS  ? ?  ?Musculoskeletal ? ? Abdominal ?  ?Peds ? Hematology ?  ?Anesthesia Other Findings ?Past Medical History: ?No date: Barrett esophagus ?No date: COPD (chronic obstructive pulmonary disease) (HCC) ?No date: Diabetes mellitus without complication (HCC) ?No date: Multiple sclerosis (HCC) ? Reproductive/Obstetrics ? ?  ? ? ? ? ? ? ? ? ? ? ? ? ? ?  ?  ? ? ? ? ? ? ? ? ?Anesthesia Physical ?Anesthesia Plan ? ?ASA: 4 and emergent ? ?Anesthesia Plan: General  ? ?Post-op Pain Management: Ofirmev IV (intra-op)* and Dilaudid IV  ? ?Induction: Intravenous and Rapid sequence ? ?PONV Risk Score and Plan: 4 or greater and Ondansetron, Dexamethasone and Treatment may vary due to age or medical condition ? ?Airway Management Planned: Oral ETT and Video Laryngoscope Planned ? ?Additional Equipment: None ? ?Intra-op Plan:   ? ?Post-operative Plan: Extubation in OR and Possible Post-op intubation/ventilation ? ?Informed Consent: I have reviewed the patients History and Physical, chart, labs and discussed the procedure including the risks, benefits and alternatives for the proposed anesthesia with the patient or authorized representative who has indicated his/her understanding and acceptance.  ? ? ? ?Dental advisory given and Consent reviewed with POA ? ?Plan Discussed with: CRNA and Surgeon ? ?Anesthesia Plan Comments: (Discussed risks of anesthesia with patient, including PONV, sore throat, lip/dental/eye damage. Rare risks discussed as well, such as cardiorespiratory and neurological sequelae, and allergic reactions. Discussed the role of CRNA in patient's perioperative care. Patient understands. ? ? ?Patient is AOx3 but quite lethargic, difficult to understand. Does know what surgery he is having done. Unclear if fully able to understand risks including possible post op intubation. I called son Onalee Hua by phone as well to discuss the above risks (he is the one who signed surgical consent for the patient due to confusion). Onalee Hua agrees and all questions answered.)  ? ? ? ? ? ? ?Anesthesia Quick Evaluation ? ?

## 2021-12-31 NOTE — Consult Note (Addendum)
H&P Note: ? ?Date of Admission:  12/31/2021 ? ?Reason for Admission: Free air, dislodged feeding tube ? ?History of Present Illness: ?Mike Parks is a 76 y.o. male presenting for evaluation of worsening abdominal pain.  The patient has a history of multiple sclerosis and about a year ago the patient had a feeding tube placed.  There is a note from the emergency room on 10/24/2020 because his PCP told him that he could either be on hospice or get a feeding tube so he came to the emergency room for feeding tube.  He was referred to the [place] in [place].  Looking in care everywhere, he does have a diagnosis of dysphagia likely from his multiple sclerosis and had a PEG tube placement on 11/30/2020 at the [facility]. ? ?The patient presented to the hospital on 12/29/2021 because it feeding tube had become dislodged.  It appears the tube was placed at bedside and the patient was discharged home.  I do not see any confirmatory test or image for tube placement.  The patient reports that later that night he started having worsening abdominal pain.  The patient's son over the phone tells me that yesterday he started developing worsening pain and started having episodes of emesis this morning.  The emesis has been dark black in color, like coffee-ground emesis.  Denies any fevers or chills.  In the emergency room today, he was found to be hypotensive, tachycardic.  On my evaluation upon entering the room, he does appear very frail and is complaining of abdominal pain and "not feeling too good".  He is spitting foamy phlegm with coffee-ground material.  His laboratory work-up shows hyponatremia and hypochloremia with AKI with a creatinine of 1.79 and BUN 33 with a lactic acid of 5.8 initially going down only to 4.8 after IV fluids and a white blood cell count of 11.3.  CT scan of the chest, abdomen, and pelvis shows a malposition gastrostomy tube which appears to be in the peritoneal cavity and not inside the stomach or intestine.  There  may be diverticulitis of the transverse colon posterior to the area where the tube is going in with a small to moderate volume of free air and free fluid in the pelvis.  I personally viewed the images and agree with the findings.  I did not personally see a source of free air other than the tube. ? ?Past Medical History: ?Past Medical History:  ?Diagnosis Date  ? Barrett esophagus   ? COPD (chronic obstructive pulmonary disease) (HCC)   ? Diabetes mellitus without complication (HCC)   ? Multiple sclerosis (HCC)   ?  ? ?Past Surgical History: ?Past Surgical History:  ?Procedure Laterality Date  ? APPENDECTOMY    ? ? ?Home Medications: ?Prior to Admission medications   ?Medication Sig Start Date End Date Taking? Authorizing Provider  ?albuterol (VENTOLIN HFA) 108 (90 Base) MCG/ACT inhaler Inhale 2 puffs into the lungs every 6 (six) hours as needed for wheezing or shortness of breath. 04/15/19   Jene Every, MD  ?albuterol (VENTOLIN HFA) 108 (90 Base) MCG/ACT inhaler Inhale 2 puffs into the lungs every 6 (six) hours as needed for wheezing or shortness of breath. 06/21/19   Nita Sickle, MD  ?atorvastatin (LIPITOR) 40 MG tablet Take 20 mg by mouth daily. 03/20/19   [provider]  ?azithromycin (ZITHROMAX) 250 MG tablet Take 1 a day for 4 days 06/21/19   Don Perking, [place], MD  ?baclofen (LIORESAL) 10 MG tablet Take 10 mg by  mouth 2 (two) times daily. 04/29/19   [provider]  ?gabapentin (NEURONTIN) 300 MG capsule Take 300 mg by mouth 3 (three) times daily. 04/29/19   [provider]  ?omeprazole (PRILOSEC) 20 MG capsule Take 40 mg by mouth 2 (two) times daily. 06/17/19   [provider]  ?oxyCODONE-acetaminophen (PERCOCET) 5-325 MG tablet Take 1 tablet by mouth every 4 (four) hours as needed for severe pain. 02/24/21   Minna Antis, MD  ?Laurence Compton 2.5-2.5 MCG/ACT AERS Inhale 2 puffs into the lungs daily. 06/17/19   [provider]   ?triamterene-hydrochlorothiazide (MAXZIDE-25) 37.5-25 MG tablet Take 1 tablet by mouth daily. 04/29/19   [provider]  ? ? ?Allergies: ?No Known Allergies ? ?Social History: ? reports that he has quit smoking. He has never used smokeless tobacco. He reports that he does not currently use alcohol. He reports that he does not currently use drugs.  ? ?Family History: ?History reviewed. No pertinent family history. ? ?Review of Systems: ?Review of Systems  ?Constitutional:  Negative for chills and fever.  ?HENT:  Negative for hearing loss.   ?Respiratory:  Negative for shortness of breath.   ?Cardiovascular:  Negative for chest pain.  ?Gastrointestinal:  Positive for abdominal pain, nausea and vomiting. Negative for constipation and diarrhea.  ?Genitourinary:  Negative for dysuria.  ?Musculoskeletal:  Negative for myalgias.  ?Skin:  Negative for rash.  ?Neurological:  Negative for dizziness.  ?Psychiatric/Behavioral:  Negative for depression.   ? ?Physical Exam ?BP 125/70   Pulse (!) 102   Temp 98.2 ?F (36.8 ?C) (Oral)   Resp (!) 21   SpO2 94%  ?CONSTITUTIONAL: Patient does appear to be in some pain distress and appears very frail and cachectic ?HEENT:  Normocephalic, atraumatic, extraocular motion intact. ?NECK: Trachea is midline, and there is no jugular venous distension. ?RESPIRATORY:  Normal respiratory effort without pathologic use of accessory muscles. ?CARDIOVASCULAR: Sinus tachycardia on EKG and monitor. ?GI: The abdomen is soft, mildly distended, tender to palpation throughout with some mild peritonitis.  There is a feeding tube in the left upper quadrant with liquid feeds in the tubing.  ?MUSCULOSKELETAL:  Normal muscle strength and tone in all four extremities.  No peripheral edema or cyanosis. ?SKIN: Skin turgor is normal. There are no pathologic skin lesions.  ?NEUROLOGIC:  Motor and sensation is grossly normal.  Cranial nerves are grossly intact. ?PSYCH:  Alert and oriented to person,  place and time. Affect is normal. ? ?Laboratory Analysis: ?Results for orders placed or performed during the hospital encounter of 12/31/21 (from the past 24 hour(s))  ?Comprehensive metabolic panel     Status: Abnormal  ? Collection Time: 12/31/21  4:30 PM  ?Result Value Ref Range  ? Sodium 132 (L) 135 - 145 mmol/L  ? Potassium 3.5 3.5 - 5.1 mmol/L  ? Chloride 88 (L) 98 - 111 mmol/L  ? CO2 26 22 - 32 mmol/L  ? Glucose, Bld 166 (H) 70 - 99 mg/dL  ? BUN 33 (H) 8 - 23 mg/dL  ? Creatinine, Ser 1.79 (H) 0.61 - 1.24 mg/dL  ? Calcium 9.7 8.9 - 10.3 mg/dL  ? Total Protein 7.5 6.5 - 8.1 g/dL  ? Albumin 3.6 3.5 - 5.0 g/dL  ? AST 48 (H) 15 - 41 U/L  ? ALT 16 0 - 44 U/L  ? Alkaline Phosphatase 89 38 - 126 U/L  ? Total Bilirubin 1.6 (H) 0.3 - 1.2 mg/dL  ? GFR, Estimated 39 (L) >60 mL/min  ?  Anion gap 18 (H) 5 - 15  ?CBC     Status: Abnormal  ? Collection Time: 12/31/21  4:30 PM  ?Result Value Ref Range  ? WBC 11.3 (H) 4.0 - 10.5 K/uL  ? RBC 4.79 4.22 - 5.81 MIL/uL  ? Hemoglobin 14.4 13.0 - 17.0 g/dL  ? HCT 43.2 39.0 - 52.0 %  ? MCV 90.2 80.0 - 100.0 fL  ? MCH 30.1 26.0 - 34.0 pg  ? MCHC 33.3 30.0 - 36.0 g/dL  ? RDW 14.1 11.5 - 15.5 %  ? Platelets 319 150 - 400 K/uL  ? nRBC 0.0 0.0 - 0.2 %  ?Type and screen [facility]     Status: None  ? Collection Time: 12/31/21  4:30 PM  ?Result Value Ref Range  ? ABO/RH(D) A POS   ? Antibody Screen NEG   ? Sample Expiration    ?  01/03/2022,2359 ?Performed at [facility], [address], [place], [place] [address] ?  ?Troponin I (High Sensitivity)     Status: None  ? Collection Time: 12/31/21  4:46 PM  ?Result Value Ref Range  ? Troponin I (High Sensitivity) 9 <18 ng/L  ?Lactic acid, plasma     Status: Abnormal  ? Collection Time: 12/31/21  5:32 PM  ?Result Value Ref Range  ? Lactic Acid, Venous 5.8 (HH) 0.5 - 1.9 mmol/L  ?Lactic acid, plasma     Status: Abnormal  ? Collection Time: 12/31/21  6:56 PM  ?Result Value Ref Range  ? Lactic Acid, Venous 4.8 (HH) 0.5  - 1.9 mmol/L  ? ? ?Imaging: ?CT CHEST ABDOMEN PELVIS W CONTRAST ? ?Result Date: 12/31/2021 ?CLINICAL DATA:  Sepsis. Pt to ED via POV from home. Pt brought in by son for feeding tube being clogged and seen 2 days ago for the same. Pt also

## 2021-12-31 NOTE — ED Triage Notes (Signed)
Pt to ED via POV from home. Pt brought in by son for feeding tube being clogged and seen 2 days ago for the same. Pt also having coffee ground emesis and CP in triage. Pt reports coffee ground emesis since last night.  ?

## 2021-12-31 NOTE — Anesthesia Procedure Notes (Signed)
Procedure Name: Intubation ?Date/Time: 12/31/2021 11:01 PM ?Performed by: Rolla Plate, CRNA ?Pre-anesthesia Checklist: Patient identified, Patient being monitored, Timeout performed, Emergency Drugs available and Suction available ?Patient Re-evaluated:Patient Re-evaluated prior to induction ?Oxygen Delivery Method: Circle system utilized ?Preoxygenation: Pre-oxygenation with 100% oxygen ?Induction Type: IV induction and Rapid sequence ?Laryngoscope Size: McGraph and 4 ?Grade View: Grade I ?Tube type: Oral ?Tube size: 7.5 mm ?Number of attempts: 1 ?Airway Equipment and Method: Stylet and Video-laryngoscopy ?Placement Confirmation: ETT inserted through vocal cords under direct vision, positive ETCO2 and breath sounds checked- equal and bilateral ?Secured at: 22 cm ?Tube secured with: Tape ?Dental Injury: Teeth and Oropharynx as per pre-operative assessment  ?Comments: RSI with VL, cords visualized and op sxn with dark color secretions.  ? ? ? ? ?

## 2022-01-01 ENCOUNTER — Inpatient Hospital Stay: Payer: Self-pay

## 2022-01-01 ENCOUNTER — Other Ambulatory Visit: Payer: Self-pay

## 2022-01-01 DIAGNOSIS — K668 Other specified disorders of peritoneum: Secondary | ICD-10-CM | POA: Diagnosis not present

## 2022-01-01 DIAGNOSIS — T85528A Displacement of other gastrointestinal prosthetic devices, implants and grafts, initial encounter: Secondary | ICD-10-CM | POA: Diagnosis not present

## 2022-01-01 LAB — CBC
HCT: 36 % — ABNORMAL LOW (ref 39.0–52.0)
Hemoglobin: 12.1 g/dL — ABNORMAL LOW (ref 13.0–17.0)
MCH: 30.3 pg (ref 26.0–34.0)
MCHC: 33.6 g/dL (ref 30.0–36.0)
MCV: 90 fL (ref 80.0–100.0)
Platelets: 213 10*3/uL (ref 150–400)
RBC: 4 MIL/uL — ABNORMAL LOW (ref 4.22–5.81)
RDW: 14.1 % (ref 11.5–15.5)
WBC: 16.1 10*3/uL — ABNORMAL HIGH (ref 4.0–10.5)
nRBC: 0 % (ref 0.0–0.2)

## 2022-01-01 LAB — URINALYSIS, COMPLETE (UACMP) WITH MICROSCOPIC
Bilirubin Urine: NEGATIVE
Glucose, UA: NEGATIVE mg/dL
Ketones, ur: NEGATIVE mg/dL
Leukocytes,Ua: NEGATIVE
Nitrite: NEGATIVE
Protein, ur: 30 mg/dL — AB
Specific Gravity, Urine: 1.039 — ABNORMAL HIGH (ref 1.005–1.030)
Squamous Epithelial / HPF: NONE SEEN (ref 0–5)
pH: 5 (ref 5.0–8.0)

## 2022-01-01 LAB — BASIC METABOLIC PANEL
Anion gap: 9 (ref 5–15)
BUN: 41 mg/dL — ABNORMAL HIGH (ref 8–23)
CO2: 25 mmol/L (ref 22–32)
Calcium: 8.2 mg/dL — ABNORMAL LOW (ref 8.9–10.3)
Chloride: 99 mmol/L (ref 98–111)
Creatinine, Ser: 1.37 mg/dL — ABNORMAL HIGH (ref 0.61–1.24)
GFR, Estimated: 53 mL/min — ABNORMAL LOW (ref >60)
Glucose, Bld: 140 mg/dL — ABNORMAL HIGH (ref 70–99)
Potassium: 4 mmol/L (ref 3.5–5.1)
Sodium: 133 mmol/L — ABNORMAL LOW (ref 135–145)

## 2022-01-01 LAB — GLUCOSE, CAPILLARY
Glucose-Capillary: 134 mg/dL — ABNORMAL HIGH (ref 70–99)
Glucose-Capillary: 120 mg/dL — ABNORMAL HIGH (ref 70–99)
Glucose-Capillary: 100 mg/dL — ABNORMAL HIGH (ref 70–99)
Glucose-Capillary: 93 mg/dL (ref 70–99)
Glucose-Capillary: 124 mg/dL — ABNORMAL HIGH (ref 70–99)
Glucose-Capillary: 108 mg/dL — ABNORMAL HIGH (ref 70–99)

## 2022-01-01 LAB — PHOSPHORUS: Phosphorus: 4.4 mg/dL (ref 2.5–4.6)

## 2022-01-01 LAB — MAGNESIUM: Magnesium: 1.6 mg/dL — ABNORMAL LOW (ref 1.7–2.4)

## 2022-01-01 LAB — CBG MONITORING, ED: Glucose-Capillary: 152 mg/dL — ABNORMAL HIGH (ref 70–99)

## 2022-01-01 LAB — LACTIC ACID, PLASMA: Lactic Acid, Venous: 1.5 mmol/L (ref 0.5–1.9)

## 2022-01-01 MED ORDER — ACETAMINOPHEN 10 MG/ML IV SOLN
INTRAVENOUS | Status: DC | PRN
  Administered 2022-01-01: 1000 mg via INTRAVENOUS

## 2022-01-01 MED ORDER — LACTATED RINGERS IV SOLN: INTRAVENOUS | Status: DC | PRN

## 2022-01-01 MED ORDER — CHLORHEXIDINE GLUCONATE CLOTH 2 % EX PADS
6.0000 | MEDICATED_PAD | Freq: Every day | CUTANEOUS | Status: DC
  Administered 2022-01-01 – 2022-01-14 (×12): 6 via TOPICAL

## 2022-01-01 MED ORDER — SUGAMMADEX SODIUM 200 MG/2ML IV SOLN
INTRAVENOUS | Status: DC | PRN
  Administered 2022-01-01: 150 mg via INTRAVENOUS

## 2022-01-01 MED ORDER — INSULIN ASPART 100 UNIT/ML IJ SOLN
0.0000 [IU] | INTRAMUSCULAR | Status: DC
  Administered 2022-01-01 – 2022-01-02 (×3): 1 [IU] via SUBCUTANEOUS
  Filled 2022-01-01 (×3): qty 1

## 2022-01-01 MED ORDER — ACETAMINOPHEN 10 MG/ML IV SOLN
INTRAVENOUS | Status: AC
  Filled 2022-01-01: qty 100

## 2022-01-01 MED ORDER — 0.9 % SODIUM CHLORIDE (POUR BTL) OPTIME
TOPICAL | Status: DC | PRN
  Administered 2022-01-01: 2000 mL

## 2022-01-01 MED ORDER — FENTANYL CITRATE (PF) 100 MCG/2ML IJ SOLN
25.0000 ug | INTRAMUSCULAR | Status: DC | PRN
  Administered 2022-01-01: 25 ug via INTRAVENOUS

## 2022-01-01 MED ORDER — LACTATED RINGERS IV SOLN
INTRAVENOUS | Status: DC
Start: 2022-01-01 — End: 2022-01-02

## 2022-01-01 MED ORDER — MAGNESIUM SULFATE 2 GM/50ML IV SOLN
2.0000 g | Freq: Once | INTRAVENOUS | Status: AC
Start: 2022-01-01 — End: 2022-01-01
  Administered 2022-01-01: 2 g via INTRAVENOUS
  Filled 2022-01-01: qty 50

## 2022-01-01 MED ORDER — OXYCODONE HCL 5 MG/5ML PO SOLN: 5.0000 mg | Freq: Once | ORAL | Status: DC | PRN

## 2022-01-01 MED ORDER — FLUCONAZOLE IN SODIUM CHLORIDE 400-0.9 MG/200ML-% IV SOLN
400.0000 mg | Freq: Once | INTRAVENOUS | Status: AC
  Administered 2022-01-01: 400 mg via INTRAVENOUS
  Filled 2022-01-01: qty 200

## 2022-01-01 MED ORDER — BUPIVACAINE-EPINEPHRINE 0.25% -1:200000 IJ SOLN
INTRAMUSCULAR | Status: DC | PRN
  Administered 2022-01-01: 30 mL

## 2022-01-01 MED ORDER — ENOXAPARIN SODIUM 40 MG/0.4ML IJ SOSY
40.0000 mg | PREFILLED_SYRINGE | INTRAMUSCULAR | Status: DC
  Administered 2022-01-02 – 2022-01-09 (×8): 40 mg via SUBCUTANEOUS
  Filled 2022-01-01 (×8): qty 0.4

## 2022-01-01 MED ORDER — PHENYLEPHRINE HCL (PRESSORS) 10 MG/ML IV SOLN
INTRAVENOUS | Status: DC | PRN
Start: 2022-01-01 — End: 2022-01-01
  Administered 2022-01-01 (×2): 80 ug via INTRAVENOUS

## 2022-01-01 MED ORDER — ACETAMINOPHEN 10 MG/ML IV SOLN: 1000.0000 mg | Freq: Once | INTRAVENOUS | Status: DC | PRN

## 2022-01-01 MED ORDER — PROPOFOL 500 MG/50ML IV EMUL
INTRAVENOUS | Status: DC | PRN
  Administered 2022-01-01: 70 ug/kg/min via INTRAVENOUS

## 2022-01-01 MED ORDER — FENTANYL CITRATE (PF) 100 MCG/2ML IJ SOLN
INTRAMUSCULAR | Status: AC
  Filled 2022-01-01: qty 2

## 2022-01-01 MED ORDER — OXYCODONE HCL 5 MG PO TABS: 5.0000 mg | ORAL_TABLET | Freq: Once | ORAL | Status: DC | PRN

## 2022-01-01 MED ORDER — SODIUM CHLORIDE 0.9 % IV SOLN
INTRAVENOUS | Status: DC | PRN
  Administered 2022-01-01: 30 mL

## 2022-01-01 MED ORDER — TRAVASOL 10 % IV SOLN
INTRAVENOUS | Status: AC
  Filled 2022-01-01: qty 470.4

## 2022-01-01 MED ORDER — ONDANSETRON HCL 4 MG/2ML IJ SOLN: 4.0000 mg | Freq: Once | INTRAMUSCULAR | Status: DC | PRN

## 2022-01-01 MED ORDER — PANTOPRAZOLE SODIUM 40 MG IV SOLR
40.0000 mg | Freq: Two times a day (BID) | INTRAVENOUS | Status: DC
  Administered 2022-01-01 – 2022-01-06 (×11): 40 mg via INTRAVENOUS
  Filled 2022-01-01 (×11): qty 10

## 2022-01-01 MED ORDER — HYDROMORPHONE HCL 1 MG/ML IJ SOLN: 0.5000 mg | INTRAMUSCULAR | Status: DC | PRN

## 2022-01-01 MED ORDER — SODIUM CHLORIDE 0.9% FLUSH
10.0000 mL | Freq: Two times a day (BID) | INTRAVENOUS | Status: DC
  Administered 2022-01-02 – 2022-01-10 (×17): 10 mL
  Administered 2022-01-10: 20 mL
  Administered 2022-01-11 – 2022-01-13 (×6): 10 mL
  Administered 2022-01-14: 40 mL
  Administered 2022-01-14 – 2022-01-15 (×2): 10 mL

## 2022-01-01 MED ORDER — FLUCONAZOLE IN SODIUM CHLORIDE 200-0.9 MG/100ML-% IV SOLN
200.0000 mg | INTRAVENOUS | Status: DC
  Administered 2022-01-02 – 2022-01-04 (×3): 200 mg via INTRAVENOUS
  Filled 2022-01-01 (×3): qty 100

## 2022-01-01 MED ORDER — ONDANSETRON HCL 4 MG/2ML IJ SOLN
INTRAMUSCULAR | Status: DC | PRN
  Administered 2022-01-01: 4 mg via INTRAVENOUS

## 2022-01-01 MED ORDER — SODIUM CHLORIDE 0.9% FLUSH
10.0000 mL | INTRAVENOUS | Status: DC | PRN
  Administered 2022-01-01: 10 mL

## 2022-01-01 NOTE — Transfer of Care (Signed)
Immediate Anesthesia Transfer of Care Note ? ?Patient: Mike Parks ? ?Procedure(s) Performed: Gastrostomy tube removed, EXPLORATORY LAPAROTOMY, gastrostomy tube placement, repair of gastrocolonic fistula (Abdomen) ? ?Patient Location: PACU ? ?Anesthesia Type:General ? ?Level of Consciousness: drowsy ? ?Airway & Oxygen Therapy: Patient Spontanous Breathing and Patient connected to face mask oxygen ? ?Post-op Assessment: Report given to RN and Post -op Vital signs reviewed and stable ? ?Post vital signs: Reviewed ? ?Last Vitals:  ?Vitals Value Taken Time  ?BP 95/69 01/01/22 0305  ?Temp    ?Pulse 90 01/01/22 0308  ?Resp 15 01/01/22 0308  ?SpO2 97 % 01/01/22 0308  ?Vitals shown include unvalidated device data. ? ?Last Pain:  ?Vitals:  ? 12/31/21 1632  ?TempSrc: Oral  ?   ? ?  ? ?Complications: No notable events documented. ?

## 2022-01-01 NOTE — Plan of Care (Signed)
Continuing with plan of care. 

## 2022-01-01 NOTE — Progress Notes (Signed)
Pharmacy Antibiotic Note ? ?Mike Parks is a 63 yom w/ PMH of  COPD, multiple sclerosis, diabetes, Barrett esophagus presenting to [facility] ED from home with complaints of abdominal pain in the setting of a suspected clogged PEG tube and coffee ground emesis since Thurs night 12/30/21. Pharmacy has been consulted for fluconazole and Zosyn dosing. Renal function has improved slightly since admission ? ?Plan: ? ?1) continue Zosyn 3.375g IV q8h (4 hour infusion) ? ?2) start fluconazole 400 mg IV x 1, then 200 mg IV every 24 hours ?  ? ?Temp (24hrs), Avg:97.8 ?F (36.6 ?C), Min:97.2 ?F (36.2 ?C), Max:98.3 ?F (36.8 ?C) ? ?Recent Labs  ?Lab 12/31/21 ?1630 12/31/21 ?1732 12/31/21 ?1856 01/01/22 ?0630  ?WBC 11.3*  --   --  16.1*  ?CREATININE 1.79*  --   --  1.37*  ?LATICACIDVEN  --  5.8* 4.8* 1.5  ?  ?Estimated Creatinine Clearance: 35.3 mL/min (A) (by C-G formula based on SCr of 1.37 mg/dL (H)).   ? ?No Known Allergies ? ?Antimicrobials this admission: ?03/17 Zosyn >>  ?03/18 fluconazole >>  ? ?Microbiology results: ?03/17 BCx: NG (pending) ?03/17 UCx: pending  ? ?Thank you for allowing pharmacy to be a part of this patient?s care. ? ?Lowella Bandy ?01/01/2022 9:28 AM ? ?

## 2022-01-01 NOTE — Progress Notes (Signed)
PHARMACY - TOTAL PARENTERAL NUTRITION CONSULT NOTE  ? ?Indication: Intolerance to enteral feeding ? ?Assessment: 76 yo M w/ PMH of  COPD, multiple sclerosis, diabetes, Barrett esophagus presenting to [facility] ED from home with complaints of abdominal pain in the setting of a suspected clogged PEG tube and coffee ground emesis since Thurs night 12/30/21.  ? ?Glucose / Insulin: q4h sSSI ?BG previous 24h: 120 - 134 (SSI required 1 unit) ?Electrolytes: hypomagnesemia ?Renal: SCr 1.79-->1.37 ?Hepatic: AST 48 / ALT wnl, INR 1.1 ?Intake / Output; MIVF: lactated ringers at 100 mL/hr ?GI Imaging: ?GI Surgeries / Procedures:  ?03/18: exploratory laparotomy ? ?Central access: PICC pending ?TPN start date: 01/01/22 (pending) ? ?Nutritional Goals: ?Goal TPN rate is 70 mL/hr (provides 100 g of protein and 1900 kcals per day) ? ?RD Assessment:  ? ?Estimated Nutritional Needs:  ?  ?Kcal:  1800-2000 ?  ?Protein:  90-105g ?  ?Fluid:  1.8L/day ?  ? ?Current Nutrition:  ?NPO ? ?Plan:  ?Start TPN at 56mL/hr at 1800 (total volume including overfill 960 mL) ?Nutritional components ?Amino acids (using 10% Travasol): 47 grams ?Dextrose: 130.2 grams ?Lipids (using 20% SMOFlipids): 26.8 grams ?kCal (24h): 898.8  ?Electrolytes in TPN (standard): Na 63mEq/L, K 30mEq/L, Ca 35mEq/L, Mg 73mEq/L, and Phos 57mmol/L. Cl:Ac 1:1 ?Add standard MVI and trace elements to TPN ?continue Sensitive q4h SSI and adjust as needed  ?Reduce MIVF to 65 mL/hr at 1800 ?Monitor TPN labs on Mon/Thurs, daily until stable ? ?Lowella Bandy ?01/01/2022,8:29 AM ? ?

## 2022-01-01 NOTE — Progress Notes (Signed)
01/01/2022 ? 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  No acute events overnight.  The patient is now off norepinephrine and his blood pressure has remained stable.  His kidney function is also improving and although his white blood cell count is elevated today to 16.1, I think this likely reactive in nature.  His lactic acid has also normalized this morning. ? ?Vital signs: ?Temp:  [97.2 ?F (36.2 ?C)-98.8 ?F (37.1 ?C)] 98.8 ?F (37.1 ?C) (03/18 0800) ?Pulse Rate:  [73-111] 74 (03/18 1200) ?Resp:  [10-21] 16 (03/18 1200) ?BP: (79-125)/(59-77) 103/61 (03/18 1200) ?SpO2:  [91 %-100 %] 95 % (03/18 1200)  ? ?Intake/Output: ?03/17 0701 - 03/18 0700 ?In: 2800 [I.V.:2700; IV Piggyback:100] ?Out: 960 [Urine:500; Emesis/NG output:50; Drains:60; Blood:50] ?Last BM Date :  (UTA) ? ?Physical Exam: ?Constitutional: No acute distress ?Abdomen: Soft, nondistended, appropriately tender to palpation.  Midline incision is clean, dry, intact staples and Penrose drain in place.  Bilateral Blake drains with serosanguineous fluid.  Feeding tube currently capped at stable position 4 cm at the skin or 5 cm at the bumper. ? ?Labs:  ?Recent Labs  ?  12/31/21 ?1630 01/01/22 ?0630  ?WBC 11.3* 16.1*  ?HGB 14.4 12.1*  ?HCT 43.2 36.0*  ?PLT 319 213  ? ?Recent Labs  ?  12/31/21 ?1630 01/01/22 ?0630  ?NA 132* 133*  ?K 3.5 4.0  ?CL 88* 99  ?CO2 26 25  ?GLUCOSE 166* 140*  ?BUN 33* 41*  ?CREATININE 1.79* 1.37*  ?CALCIUM 9.7 8.2*  ? ?Recent Labs  ?  12/31/21 ?1732  ?LABPROT 14.3  ?INR 1.1  ? ? ?Imaging: ?CT CHEST ABDOMEN PELVIS W CONTRAST ? ?Result Date: 12/31/2021 ?CLINICAL DATA:  Sepsis. Pt to ED via POV from home. Pt brought in by son for feeding tube being clogged and seen 2 days ago for the same. Pt also having coffee ground emesis and CP in triage. Pt reports coffee ground emesis since last night.  EXAM: CT CHEST, ABDOMEN, AND PELVIS WITH CONTRAST TECHNIQUE: Multidetector CT imaging of the chest, abdomen and pelvis was performed following the standard protocol during bolus administration of intravenous contrast. RADIATION DOSE REDUCTION: This exam was performed according to the departmental dose-optimization program which includes automated exposure control, adjustment of the mA and/or kV according to patient size and/or use of iterative reconstruction technique. CONTRAST:  58mL OMNIPAQUE IOHEXOL 300 MG/ML  SOLN COMPARISON:  None. FINDINGS: CT CHEST FINDINGS Cardiovascular: Normal heart size. No significant pericardial effusion. The thoracic aorta is normal in caliber. Mild no atherosclerotic plaque of the thoracic aorta. At least 3 vessel coronary artery calcifications. The main pulmonary artery is normal in caliber. No central pulmonary embolus. Mediastinum/Nodes: Calcified left hilar lymph nodes. No enlarged mediastinal, hilar, or axillary lymph nodes. Thyroid gland in trachea demonstrate no significant findings. The esophageal lumen is dilated with fluid. Moderate volume hiatal hernia. Lungs/Pleura: Right base consolidation that appears to be slightly irregular and nodular-represent right base atelectasis (2:33, 5:57). No focal consolidation. No pulmonary nodule. There is a partially calcified left subpleural 3.1 x 2.3 cm mass (2:37). No pleural effusion. No pneumothorax. Musculoskeletal: No chest wall abnormality. No suspicious lytic or blastic osseous lesions. No acute displaced fracture. Multilevel degenerative changes of the spine. 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 CT ABDOMEN PELVIS FINDINGS Hepatobiliary: Liver is enlarged measuring up to 19 cm. No focal liver abnormality. No  gallstones, gallbladder wall thickening, or pericholecystic fluid. No biliary dilatation. Pancreas: No focal lesion. Normal pancreatic contour. No surrounding inflammatory changes. No main  pancreatic ductal dilatation. Spleen: Normal in size without focal abnormality. Adrenals/Urinary Tract: No adrenal nodule bilaterally. Bilateral kidneys enhance symmetrically. No hydronephrosis. No hydroureter. The urinary bladder is unremarkable. On delayed imaging, there is no urothelial wall thickening and there are no filling defects in the opacified portions of the bilateral collecting systems or ureters. Stomach/Bowel: Stomach is otherwise within normal limits. Mid transverse colon demonstrates bowel wall thickening surrounding a likely diverticula (6:93, 2:59). Associated anterior adjacent intraperitoneal gastrostomy tube tube. No evidence of small bowel wall thickening or dilatation. No pneumatosis. Stool throughout the colon. The appendix is not definitely identified with no inflammatory changes in the right lower quadrant to suggest acute appendicitis. Vascular/Lymphatic: No abdominal aorta or iliac aneurysm. Mild to moderate atherosclerotic plaque of the aorta and its branches. No abdominal, pelvic, or inguinal lymphadenopathy. Reproductive: Prostate is unremarkable. Other: Small volume ascites measuring slightly higher than simple free fluid. Small volume free intraperitoneal gas. No organized fluid collection. Musculoskeletal: No abdominal wall hernia or abnormality. No suspicious lytic or blastic osseous lesions. Age-indeterminate L2 incomplete burst fracture with at least 20% height loss and 2 mm retropulsion into the central canal. Multilevel degenerative changes of the spine. IMPRESSION: 1. Malpositioned/dislodged intraperitoneal gastrostomy tube. 2. Query transverse colon diverticulitis just posterior to the malpositioned gastrostomy tube. 3. Small to moderate volume pneumoperitoneum and free fluid measuring slightly higher than simple fluid. Findings likely due to a malpositioned/dislodged intraperitoneal gastrostomy tube. Less likely complicated/perforated mid transverse colon diverticulitis. 4.  Moderate volume hiatal hernia with esophageal lumen dilated with fluid. 5. Constipation. 6. Age-indeterminate L2 incomplete burst fracture with at least 20% height loss and 2 mm retropulsion into the central canal. Correlate with point tenderness to palpation to evaluate for an acute component. 7. Right base consolidation likely represents atelectasis. Given slight irregularity and nodularity, consider follow-up CT in 3 months to evaluate for resolution. 8. 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 Finding likely sequelae of prior granulomatous disease. 9. 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 These results were called by telephone at the time of interpretation on 12/31/2021 at 6:31 pm to provider PHILLIP STAFFORD , who verbally acknowledged these results. Electronically Signed   By: Tish Frederickson M.D.   On: 12/31/2021 18:42  ? ?[place] EKG SITE RITE ? ?Result Date: 01/01/2022 ?If [employer] not attached, placement could not be confirmed due to current cardiac rhythm.  ? ?Assessment/Plan: ?This is a 76 y.o. 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 ? ?- Patient is improving clinically with no longer needing pressors normalize lactic acid improving renal function.  His pain appears to be controlled although it is hard to read him due to his mental status and multiple sclerosis. ?- Discussed with the patient and his son that given the repair done yesterday, he is currently to be n.p.o. for the next few days and as such, I will place order for PICC line and to start TPN tonight.  Likely he will be n.p.o. until 3/22 at which time we will do a upper GI contrast study to evaluate the repair. ?- Appreciate ICU team assistance in the management of this patient.  They have added an  antifungal for  broader coverage given the location of the perforation/fistula. ?-Continue IV Protonix twice daily and will be able to start DVT prophylaxis with Lovenox tomorrow. ?- Anticipate being able to

## 2022-01-01 NOTE — Progress Notes (Signed)
eLink Physician-Brief Progress Note ?Patient Name: Mike Parks ?DOB: [birth year] ?MRN: [id number] ? ? ?Date of Service ? 01/01/2022  ?HPI/Events of Note ? 76 yr old male with chronic G tube had g tube replaced 3/15 after it became nonfunctional.  Patient presented today with abd pain and peritonitis with finding of malpositioned g tube.  Taken to OR where tube was replaced and gastro-colonic fistula was repaired.  Now in ICU off of pressors and not intubated.     ?eICU Interventions ? Chart reviewed  ? ? ? ?Intervention Category ?Evaluation Type: New Patient Evaluation ? Henry Russel, P ?01/01/2022, 5:34 AM ?

## 2022-01-01 NOTE — Progress Notes (Signed)
Initial Nutrition Assessment ? ?DOCUMENTATION CODES:  ? ?Underweight ? ?INTERVENTION:  ? ?-TPN per Pharmacy ? ?-Please consult for TF initiation and management once PEG tube is ready to be used. ? ?NUTRITION DIAGNOSIS:  ? ?Increased nutrient needs related to acute illness as evidenced by estimated needs. ? ?GOAL:  ? ?Patient will meet greater than or equal to 90% of their needs ? ?MONITOR:  ? ?Labs, Weight trends, I & O's (TPN) ? ?REASON FOR ASSESSMENT:  ? ?Consult ?New TPN/TNA ? ?ASSESSMENT:  ? ?76 y.o. male presenting for evaluation of worsening abdominal pain.  The patient has a history of multiple sclerosis and about a year ago the patient had a feeding tube placed.  There is a note from the emergency room on 10/24/2020 because his PCP told him that he could either be on hospice or get a feeding tube so he came to the emergency room for feeding tube.  He was referred to the [facility] in [place].  Looking in care everywhere, he does have a diagnosis of dysphagia likely from his multiple sclerosis and had a PEG tube placement on 11/30/2020 at the [facility]. Admitted for G-tube becoming dislodged. ? ?3/17-3/18: s/p G-tube removed, ex lap, G-tube replaced, repair of gastrocolonic fistula ? ?Unable to gather history from patient at this time. Will attempt at follow-up. ? ?Received consult and page from Pharmacy regarding TPN consult. Per chat with RN, pt is unable to use his G-tube at this time so TPN is being initiated. Per chart review, pt has had a PEG tube since February 2022 d/t dysphagia. Became dislodged on 12/29/21, was replaced that day in ED and sent home. Pt continued to have abdominal pain, tube was replaced again early this morning by surgery. ? ?Per review of care everywhere, last documentation of tube feeding regimen from May 2022. At that time, pt was receiving 1 carton of TwoCal 4 times daily (providing ~1900 kcals,  79g protein). ? ?Per weight records, pt has lost 27 lbs since 02/24/21 (18% wt loss x 10 months,  significant for time frame).  Suspect some degree of malnutrition present. ? ?Medications: IV Mg sulfate ? ?Labs reviewed:   ?CBGs: 120-152 ?Low Na, Mg ? ?NUTRITION - FOCUSED PHYSICAL EXAM: ? ?Unable to complete, working remotely. ? ?Diet Order:   ?Diet Order   ? ?       ?  Diet NPO time specified  Diet effective now       ?  ? ?  ?  ? ?  ? ? ?EDUCATION NEEDS:  ? ?No education needs have been identified at this time ? ?Skin:  Skin Assessment: Skin Integrity Issues: ?Skin Integrity Issues:: Incisions ?Incisions: 3/18 abdomen ? ?Last BM:  PTA ? ?Height:  ? ?Ht Readings from Last 1 Encounters:  ?12/29/21 5\' 9"  (1.753 m)  ? ? ?Weight:  ? ?Wt Readings from Last 1 Encounters:  ?12/29/21 54.4 kg  ? ? ?BMI:  17.7 kg/m^2 ? ?Estimated Nutritional Needs:  ? ?Kcal:  1800-2000 ? ?Protein:  90-105g ? ?Fluid:  1.8L/day ? ?12/31/21, MS, RD, LDN ?Inpatient Clinical Dietitian ?Contact information available via Amion ? ?

## 2022-01-01 NOTE — Progress Notes (Signed)
Peripherally Inserted Central Catheter Placement ? ?The IV Nurse has discussed with the patient and/or persons authorized to consent for the patient, the purpose of this procedure and the potential benefits and risks involved with this procedure.  The benefits include less needle sticks, lab draws from the catheter, and the patient may be discharged home with the catheter. Risks include, but not limited to, infection, bleeding, blood clot (thrombus formation), and puncture of an artery; nerve damage and irregular heartbeat and possibility to perform a PICC exchange if needed/ordered by physician.  Alternatives to this procedure were also discussed.  Bard Power PICC patient education guide, fact sheet on infection prevention and patient information card has been provided to patient /or left at bedside.  Telephone consent obtained from son, Mitul Hallowell. ? ?PICC Placement Documentation  ?PICC Triple Lumen 01/01/22 Right Basilic 39 cm 0 cm (Active)  ?Indication for Insertion or Continuance of Line Administration of hyperosmolar/irritating solutions (i.e. TPN, Vancomycin, etc.) 01/01/22 1701  ?Exposed Catheter (cm) 0 cm 01/01/22 1701  ?Site Assessment Clean, Dry, Intact 01/01/22 1701  ?Lumen #1 Status Flushed;Saline locked;Blood return noted 01/01/22 1701  ?Lumen #2 Status Flushed;Saline locked;Blood return noted 01/01/22 1701  ?Lumen #3 Status Flushed;Saline locked;Blood return noted 01/01/22 1701  ?Dressing Type Securing device;Transparent 01/01/22 1701  ?Dressing Status Antimicrobial disc in place;Clean, Dry, Intact 01/01/22 1701  ?Safety Lock Not Applicable 01/01/22 1701  ?Line Care Connections checked and tightened 01/01/22 1701  ?Line Adjustment (NICU/IV Team Only) No 01/01/22 1701  ?Dressing Intervention New dressing 01/01/22 1701  ?Dressing Change Due 01/08/22 01/01/22 1701  ? ? ? ? ? ?Mike Parks ?01/01/2022, 5:02 PM ? ?

## 2022-01-01 NOTE — Brief Op Note (Signed)
12/31/2021 - 01/01/2022 ? ?3:22 AM ? ?PATIENT:  Mike Parks  76 y.o. male ? ?PRE-OPERATIVE DIAGNOSIS:  Free air in the abdomen, possible perforation, dislodged gastrostomy tube ? ?POST-OPERATIVE DIAGNOSIS:  dislodged gastrostomy tube, gastrocolonic fistula ? ?PROCEDURE:  Procedure(s): ?Gastrostomy tube removed, EXPLORATORY LAPAROTOMY, gastrostomy tube placement, repair of gastrocolonic fistula (N/A) ? ?SURGEON:  Surgeon(s) and Role: ?   Henrene Dodge, MD - Primary ? ?ANESTHESIA:   general ? ?EBL:  100 mL  ? ?BLOOD ADMINISTERED:none ? ?DRAINS: (19 Fr) Blake drain(s) in the RLQ and LLQ.   ? ?LOCAL MEDICATIONS USED:  BUPIVICAINE  ? ?SPECIMEN:  No Specimen ? ?DISPOSITION OF SPECIMEN:  N/A ? ?COUNTS:  YES ? ?DICTATION: .Dragon Dictation ? ?PLAN OF CARE: Admit to inpatient  ? ?PATIENT DISPOSITION:  PACU - hemodynamically stable. ?  ?Delay start of Pharmacological VTE agent (>24hrs) due to surgical blood loss or risk of bleeding: yes ? ?

## 2022-01-01 NOTE — Anesthesia Postprocedure Evaluation (Signed)
Anesthesia Post Note ? ?Patient: Mike Parks ? ?Procedure(s) Performed: Gastrostomy tube removed, EXPLORATORY LAPAROTOMY, gastrostomy tube placement, repair of gastrocolonic fistula (Abdomen) ? ?Patient location during evaluation: SICU ?Anesthesia Type: General ?Level of consciousness: awake and alert ?Pain management: pain level controlled ?Vital Signs Assessment: post-procedure vital signs reviewed and stable ?Respiratory status: spontaneous breathing, nonlabored ventilation and respiratory function stable ?Cardiovascular status: blood pressure returned to baseline ?Postop Assessment: no apparent nausea or vomiting ?Anesthetic complications: no ?Comments: NGT in place ? ? ?No notable events documented. ? ? ?Last Vitals:  ?Vitals:  ? 01/01/22 0900 01/01/22 1000  ?BP: 99/66 113/68  ?Pulse: 75 76  ?Resp: 14 15  ?Temp:    ?SpO2: 94% 95%  ?  ?Last Pain:  ?Vitals:  ? 01/01/22 0800  ?TempSrc: Oral  ?PainSc:   ? ? ?  ?  ?  ?  ?  ?  ? ?Johny Drilling ? ? ? ? ?

## 2022-01-01 NOTE — Op Note (Signed)
?Procedure Date:  01/01/2022 ? ?Pre-operative Diagnosis:  Pneumoperitoneum, dislodged gastrostomy feeding tube ? ?Post-operative Diagnosis: Pneumoperitoneum, dislodged gastrostomy tube, gastrocolonic fistula ? ?Procedure:  Exploratory Laparotomy, takedown of gastrocolonic fistula with primary stapled repair of gastrostomy site and colotomy site, placement of new open feeding gastrostomy tube ? ?Surgeon:  Howie Ill, MD ? ?Anesthesia:  General endotracheal ? ?Estimated Blood Loss:  100 ml ? ?Specimens:  None ? ?Complications:  None ? ?Indications for Procedure:  This is a 76 y.o. male who presents with abdominal pain and peritonitis, with CT scan showing a possibly dislodged PEG tube which was not inside the stomach, an some free air with free fluid in the pelvis.  Discussed with the family the possible plan for exploratory laparotomy and bowel resection. The risks of bleeding, abscess or infection, injury to surrounding structures, and need for further procedures, possible post-operative complications and long recovery were all discussed with the patient and was willing to proceed. ? ?Description of Procedure: ?The patient was correctly identified in the preoperative area and brought into the operating room.  The patient was placed supine with VTE prophylaxis in place.  Appropriate time-outs were performed.  Anesthesia was induced and the patient was intubated.  Foley catheter was placed.  Appropriate antibiotics were infused.  The patient's PEG tube was removed so we could prep the abdomen more easily. ? ?The abdomen was prepped and draped in a sterile fashion.  A midline incision was made and electrocautery was used to dissect down the subcutaneous tissue to the fascia.  The fascia was incised and extended superiorly and inferiorly.  Upon entering the abdomen (organ space), I encountered purulence in the pelvis and lower abdomen  related to the tube feeds leaking into the peritoneal cavity.  The stomach was  adhered close to the midline, but I was able to extend the midline incision superiorly past the stomach without injury.  Then, the stomach was separated from the abdominal wall.  This allowed [place] to evaluate that anatomy better.  It was initially unclear, but the patient had what appeared a common channel joining the stomach and a portion of the transverse colon, in a type of gastrocolonic fistula.  When trying to track the passageway going from the area separated from the abdominal wall, there were two tracks that separated from the main channel, one going to the stomach and one going to the colon.  There was no evidence of an actual perforation, so it was unclear how the feeding tube had been inserted erroneously to allow the tube feeds to leak into the abdominal cavity.  However, there was clear evidence of inflammatory changes from the leakage.  This may have been more chronic in nature and during the last PEG tube replacement on 3/15, the area opened up more to allow an uncontrolled leakage.  We decided to take down the gastrocolonic fistula.  The common channel was divided using cautery.  We started with the colonic component.  The surrounding omentum over the transverse colon was excised to clean the edges of the colon wall for excision.  The lesser sac was entered as well and fatty edges near the colon also excised.  Allis clamps were used to raise a transverse orientation of the wall of the transverse colon encompassing the fistula and a blue load TIA stapler was used to transect and seal the fistula opening.  3-0 silk sutures were used to imbricate the staple line.  There was no narrowing of the transverse colon in  doing this.  After, we moved to the gastric component.  The lesser sac was entered again and the greater curvature near the fistula was also cleared to allow for the stapler.  Allis clamps were used to isolate a wedge of the greater curvature and another blue load of the TIA stapler was used to  excise the fistula.  3-0 Silk sutures were used again to imbricate the staple line. ? ?After this, the abdomen was fully washed out using 2+ liters of warm normal saline fluid.  The small bowel was run to make sure there were no other points of perforation or injury, and the bowel was clean other than some inflammatory changes from the spilled tube feeds.  No evidence of perforation anywhere in the small bowel or colon, and then the stomach was further evaluated and there was no evidence of perforation.  After irrigation was completed, we proceeded to do the new feeding gastrostomy tube.  A spot in the LUQ about 2 fingerbreadths inferior to the costal margin was incised and hemostat was used to dissect through the abdominal wall and pass a 22 Fr. Gastrostomy tube through.  Then, an area in the greater curvature of the stomach was aligned with the tube and two concentric purse string sutures were created with 2-0 Silk.  A gastrotomy was done and the feeding tube was inserted.  Balloon was inflated and the tube was felt to be in good position.  It was deflated again and the purse strings tied.  Then, four 2-0 silk sutures were used to tack the stomach wall to the anterior abdominal wall around the feeding tube.  The balloon was inflated again, using 20 ml of NS. ? ?Exparel solution mixed with 0.5% bupivacaine with epi was infiltrated over the peritoneum, fascia, and subcutaneous tissue.  A 19 Fr Blake drain was placed in the right lower quadrant going to the pelvis for any dependent drainage, and one in the left lower quadrant going towards the two staple lines over the colon and stomach.  The fascia was then closed using #1 PDS sutures.  The midline wound was irrigated and closed using staples over a 1/4 inch penrose drain.  The drains were secured using 3-0 nylon suture.  The gastrostomy tube was cinched down with the silicone bumper, with the 4 cm mark at the skin and the 5 cm mark at the top of the bumper.  The  abdominal skin was cleaned and the midline wound was dressed with Honeycomb dressing and the drains with 4x4 gauze and tegaderm.  4x4 dressing was applied over the feeding tube. ? ?The patient was emerged from anesthesia and extubated and brought to the recovery room for further management. ? ?The patient tolerated the procedure well and all counts were correct at the end of the case. ? ? ?Howie Ill, MD  ?

## 2022-01-02 ENCOUNTER — Inpatient Hospital Stay: Payer: Medicare PPO

## 2022-01-02 DIAGNOSIS — K668 Other specified disorders of peritoneum: Secondary | ICD-10-CM | POA: Diagnosis not present

## 2022-01-02 LAB — COMPREHENSIVE METABOLIC PANEL
ALT: 12 U/L (ref 0–44)
AST: 26 U/L (ref 15–41)
Albumin: 2.1 g/dL — ABNORMAL LOW (ref 3.5–5.0)
Alkaline Phosphatase: 50 U/L (ref 38–126)
Anion gap: 6 (ref 5–15)
BUN: 39 mg/dL — ABNORMAL HIGH (ref 8–23)
CO2: 29 mmol/L (ref 22–32)
Calcium: 8.3 mg/dL — ABNORMAL LOW (ref 8.9–10.3)
Chloride: 99 mmol/L (ref 98–111)
Creatinine, Ser: 1.18 mg/dL (ref 0.61–1.24)
GFR, Estimated: >60 mL/min (ref >60)
Glucose, Bld: 124 mg/dL — ABNORMAL HIGH (ref 70–99)
Potassium: 3.2 mmol/L — ABNORMAL LOW (ref 3.5–5.1)
Sodium: 134 mmol/L — ABNORMAL LOW (ref 135–145)
Total Bilirubin: 0.7 mg/dL (ref 0.3–1.2)
Total Protein: 4.9 g/dL — ABNORMAL LOW (ref 6.5–8.1)

## 2022-01-02 LAB — URINE CULTURE: Culture: NO GROWTH

## 2022-01-02 LAB — CBC
HCT: 28.1 % — ABNORMAL LOW (ref 39.0–52.0)
Hemoglobin: 9.2 g/dL — ABNORMAL LOW (ref 13.0–17.0)
MCH: 29.9 pg (ref 26.0–34.0)
MCHC: 32.7 g/dL (ref 30.0–36.0)
MCV: 91.2 fL (ref 80.0–100.0)
Platelets: 166 10*3/uL (ref 150–400)
RBC: 3.08 MIL/uL — ABNORMAL LOW (ref 4.22–5.81)
RDW: 14.2 % (ref 11.5–15.5)
WBC: 12.1 10*3/uL — ABNORMAL HIGH (ref 4.0–10.5)
nRBC: 0 % (ref 0.0–0.2)

## 2022-01-02 LAB — GLUCOSE, CAPILLARY
Glucose-Capillary: 121 mg/dL — ABNORMAL HIGH (ref 70–99)
Glucose-Capillary: 122 mg/dL — ABNORMAL HIGH (ref 70–99)
Glucose-Capillary: 127 mg/dL — ABNORMAL HIGH (ref 70–99)
Glucose-Capillary: 121 mg/dL — ABNORMAL HIGH (ref 70–99)
Glucose-Capillary: 150 mg/dL — ABNORMAL HIGH (ref 70–99)

## 2022-01-02 LAB — BLOOD GAS, ARTERIAL
Acid-Base Excess: 5.4 mmol/L — ABNORMAL HIGH (ref 0.0–2.0)
Bicarbonate: 29 mmol/L — ABNORMAL HIGH (ref 20.0–28.0)
O2 Content: 3 L/min
O2 Saturation: 85 %
Patient temperature: 37
pCO2 arterial: 38 mmHg (ref 32–48)
pH, Arterial: 7.49 — ABNORMAL HIGH (ref 7.35–7.45)
pO2, Arterial: 48 mmHg — ABNORMAL LOW (ref 83–108)

## 2022-01-02 LAB — TRIGLYCERIDES: Triglycerides: 35 mg/dL (ref <150)

## 2022-01-02 LAB — PHOSPHORUS: Phosphorus: 2.9 mg/dL (ref 2.5–4.6)

## 2022-01-02 LAB — MAGNESIUM: Magnesium: 2.1 mg/dL (ref 1.7–2.4)

## 2022-01-02 MED ORDER — LACTATED RINGERS IV SOLN: INTRAVENOUS | Status: DC

## 2022-01-02 MED ORDER — POTASSIUM CHLORIDE 10 MEQ/50ML IV SOLN
10.0000 meq | INTRAVENOUS | Status: AC
  Administered 2022-01-02 (×3): 10 meq via INTRAVENOUS
  Filled 2022-01-02 (×3): qty 50

## 2022-01-02 MED ORDER — CHLORHEXIDINE GLUCONATE 0.12 % MT SOLN
15.0000 mL | Freq: Two times a day (BID) | OROMUCOSAL | Status: DC
  Administered 2022-01-02 – 2022-01-15 (×27): 15 mL via OROMUCOSAL
  Filled 2022-01-02 (×21): qty 15

## 2022-01-02 MED ORDER — ORAL CARE MOUTH RINSE
15.0000 mL | Freq: Two times a day (BID) | OROMUCOSAL | Status: DC
  Administered 2022-01-02 – 2022-01-15 (×26): 15 mL via OROMUCOSAL

## 2022-01-02 MED ORDER — SODIUM CHLORIDE 0.9 % IV BOLUS
500.0000 mL | Freq: Once | INTRAVENOUS | Status: AC
  Administered 2022-01-02: 500 mL via INTRAVENOUS

## 2022-01-02 MED ORDER — TRAVASOL 10 % IV SOLN
INTRAVENOUS | Status: AC
  Filled 2022-01-02: qty 940.8

## 2022-01-02 MED ORDER — INSULIN ASPART 100 UNIT/ML IJ SOLN
0.0000 [IU] | Freq: Four times a day (QID) | INTRAMUSCULAR | Status: DC
  Administered 2022-01-02 (×2): 1 [IU] via SUBCUTANEOUS
  Administered 2022-01-03: 2 [IU] via SUBCUTANEOUS
  Administered 2022-01-03: 1 [IU] via SUBCUTANEOUS
  Administered 2022-01-03 (×2): 2 [IU] via SUBCUTANEOUS
  Administered 2022-01-04: 1 [IU] via SUBCUTANEOUS
  Administered 2022-01-04: 2 [IU] via SUBCUTANEOUS
  Administered 2022-01-04 – 2022-01-05 (×3): 1 [IU] via SUBCUTANEOUS
  Administered 2022-01-05: 2 [IU] via SUBCUTANEOUS
  Administered 2022-01-05 – 2022-01-07 (×7): 1 [IU] via SUBCUTANEOUS
  Administered 2022-01-08 – 2022-01-09 (×4): 2 [IU] via SUBCUTANEOUS
  Filled 2022-01-02 (×21): qty 1

## 2022-01-02 NOTE — Progress Notes (Signed)
[facility] Progress Note ? ?Patient: Mike Parks    XHF:[id number]  DOA: 12/31/2021    ? ?Date of Service: the patient was seen and examined on 01/02/2022 ? ?Chief Complaint  ?Patient presents with  ? Hematemesis  ? ?Brief hospital course: ?76 yo M with past medical history of multiple sclerosis, Barrett esophagus, dysphagia s/p PEG tube placement 11/2020, T2DM, COPD, hypothyroid, GERD, overactive bladder, as reviewed from EMR, presented at [facility] [place] [facility] from home with complaints of abdominal pain in the setting of a suspected clogged PEG tube and coffee ground emesis since Thurs night 12/30/21. Patient had come to the ED on 12/29/21 because his PEG tube had become dislodged. Per chart review it appears as though the PEG tube was replaced bedside, no confirmatory imaging noted. Per Dr. Adelene Idler documentation & via telephone discussion with the patient's son, abdominal pain that began the evening of 12/29/21. The patient's son reported this abdominal pain worsened on 12/30/21 and then the patient began having coffee-ground emesis on the morning of 12/31/21. Per son, the patient has been able to take all his medications as prescribed. ?  ?ED course: ?Upon arrival the patient was hypotensive & tachycardic, spitting up foamy phlegm with coffee-ground material. CT imaging shows malposition of the PEG tube in the peritoneal cavity, not inside the intestine/stomach. Surgery was contacted for admission and emergent intervention. ?Medications given: levophed initiated, cefepime/flagyl, IV contrast, protonix, 1.75 mL IVF bolus ?Initial Vitals: 98.2, RR 20, HR 107, BP-79/60 & SpO2 97% on room air ?Significant labs: (Labs/ Imaging personally reviewed) ?I, Cheryll Cockayne Rust-Chester, AGACNP-BC, personally viewed and interpreted this ECG. ?EKG Interpretation: Date: 12/31/21, EKG Time: 16:30, Rate: 117,  ?Rhythm: ST, QRS Axis:  possible LAD, Intervals: normal, ST/T Wave abnormalities: none, Narrative Interpretation: Sinus  Tachycardia ?Chemistry: Na+: 132, K+: 3.5, BUN/Cr.: 33/1.79, Serum CO2/ AG: 26/18 ?Hematology: WBC: 11.3, Hgb: 14.4,  ?Troponin: 9, Lactic/ PCT: 5.8 > 4.8, COVID-19 & Influenza A/B: negative ?  ?CT 12/31/21: Malpositioned/dislodged intraperitoneal gastrostomy tube. ?Query transverse colon diverticulitis just posterior to the ?malpositioned gastrostomy tube. Small to moderate volume pneumoperitoneum and free fluid measuring slightly higher than simple fluid. Findings likely due to a malpositioned/dislodged intraperitoneal gastrostomy tube. Less likely complicated/perforated mid transverse colon diverticulitis. Moderate volume hiatal hernia with esophageal lumen dilated with fluid. Constipation. Age-indeterminate L2 incomplete burst fracture with at least 20% height loss and 2 mm retropulsion into the central canal. Correlate with point tenderness to palpation to evaluate for an acute component. Right base consolidation likely represents atelectasis. Given slight irregularity and nodularity, consider follow-up CT in 3 months to evaluate for resolution. 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 Finding likely sequelae of prior granulomatous disease. 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 ?  ?General surgeon, Dr. [name], discussed options with son and patient bedside. Decision was made for aggressive measures including exploratory laparotomy to wash out his abdomen, find any potential source of perforation & repair and replace feeding tube.  ?  ?Intra-operatively gastrostomy tube removed, ex-lap performed with new gastrostomy tube placement and repair of gastrocolonic fistula. ?PCCM consulted for additional management and ICU admission post-op in the setting of peritonitis and possible perforation. ? ?12/31/21: Patient taken emergently for ex-lap to wash out abdomen, find any potential  source of perforation/repair and replace PEG tube. Patient admitted to ICU post-op, off pressors on 2 L [place]. ? ?Patient was admitted on 12/03/2021 by general surgery  and was in the ICU, downgraded under hospitalist service on 01/02/2022 ?Please review prior detailed notes, further management as below. ? ?Assessment and Plan: ? ? ?Malpositioned/dislodged intraperitoneal gastrostomy tube and Small to moderate volume pneumoperitoneum ?General surgery was consulted ?3/18 s/p  Exploratory Laparotomy, takedown of gastrocolonic fistula with primary stapled repair of gastrostomy site and colotomy site, placement of new open feeding gastrostomy tube ?Keep n.p.o. until return of bowel functions and follow-up general surgery for tube feeding ?Continue TPN in the meantime ?Continue gentle hydration ?Continue Zosyn and Diflucan IV antibiotics ?Continue PPI 40 IV twice daily ? ? ?Home medications has been held, resume medications when allowed by general surgery ?Patient is on Synthroid, if patient is not allowed meds via PEG tube soon then may start IV Synthroid in 1 to 2 days ? ? ?Follow PT and OT eval before disposition plan ? ? ?Follow-up palliative care for goals of care discussion ? ? ?There is no height or weight on file to calculate BMI.  ?Nutrition Problem: Increased nutrient needs ?Etiology: acute illness ?Interventions: ?Interventions: TPN ? ?   ? ?Diet: Npo on TPN ?DVT Prophylaxis: Subcutaneous Lovenox  ? ?Advance goals of care discussion: Full code ? ?Family Communication: family was not present at bedside, at the time of interview.  ?The pt provided permission to discuss medical plan with the family. Opportunity was given to ask question and all questions were answered satisfactorily.  ? ?Disposition:  ?Pt is from Home, admitted with pneumoperitoneum due to PEG tube dislodgment and malposition, s/p ex lap, n.p.o. on TPN, which precludes a safe discharge. ?Discharge to home versus SNF TBD pending clinical  improvement ? ?Subjective: Patient was admitted due to abdominal pain, found to have dislodgment of PEG tube, developed pneumoperitoneum, s/p ex lap, patient is AO x1 at this time, does not feel good, whispering while talking, cannot tell his name, unable to hear him possible secondary to physical deconditioning. ?We will continue to monitor closely. ? ?Physical Exam: ?General:  lethargic, AAOx 1.  ?Appear in mild distress, affect depressed ?Eyes: PERRLA ?ENT: Oral Mucosa Clear, dry  ?Neck: no JVD,  ?Cardiovascular: S1 and S2 Present, non Murmur,  ?Respiratory: good respiratory effort, Bilateral Air entry equal and Decreased, no Crackles, no wheezes ?Abdomen: Bowel Sound sluggish, Soft and postop tenderness, dressing lower part soaked with blood, no active bleeding, b/l LQ JP drains intact ?Skin: No rashes ?Extremities: no Pedal edema, no calf tenderness ?Neurologic: without any new focal findings ?Gait not checked due to patient safety concerns ? ?Vitals:  ? 01/02/22 1055 01/02/22 1100 01/02/22 1200 01/02/22 1300  ?BP: (!) 88/55 (!) 92/54 (!) 91/52 (!) 88/54  ?Pulse: 64 65 63 64  ?Resp: (!) 21 (!) 24 (!) 23 (!) 24  ?Temp:  98.3 ?F (36.8 ?C)    ?TempSrc:      ?SpO2: 95% 97% 97% 96%  ? ? ?Intake/Output Summary (Last 24 hours) at 01/02/2022 1446 ?Last data filed at 01/02/2022 1107 ?Gross per 24 hour  ?Intake 2733.08 ml  ?Output 2025 ml  ?Net 708.08 ml  ? ?There were no vitals filed for this visit. ? ?Data Reviewed: ?I have personally reviewed and interpreted daily labs, tele strips, imagings as discussed above. ?I reviewed all nursing notes, pharmacy notes, vitals, pertinent old records ?I have discussed plan of care as described above with RN and patient/family. ? ?CBC: ?Recent Labs  ?Lab 12/31/21 ?1630 01/01/22 ?0630 01/02/22 ?5643  ?WBC 11.3* 16.1* 12.1*  ?HGB 14.4 12.1* 9.2*  ?HCT 43.2 36.0*  28.1*  ?MCV 90.2 90.0 91.2  ?PLT 319 213 166  ? ?Basic Metabolic Panel: ?Recent Labs  ?Lab 12/31/21 ?1630 01/01/22 ?0630  01/02/22 ?3825  ?NA 132* 133* 134*  ?K 3.5 4.0 3.2*  ?CL 88* 99 99  ?CO2 26 25 29   ?GLUCOSE 166* 140* 124*  ?BUN 33* 41* 39*  ?CREATININE 1.79* 1.37* 1.18  ?CALCIUM 9.7 8.2* 8.3*  ?MG  --  1.6* 2.1  ?PHOS  --  4.4 2.9  ? ? ?Studies:

## 2022-01-02 NOTE — Progress Notes (Signed)
PHARMACY - TOTAL PARENTERAL NUTRITION CONSULT NOTE  ? ?Indication: Intolerance to enteral feeding ? ?Assessment: 76 yo M w/ PMH of  COPD, multiple sclerosis, diabetes, Barrett esophagus presenting to [facility] ED from home with complaints of abdominal pain in the setting of a suspected clogged PEG tube and coffee ground emesis since Thurs night 12/30/21.  ? ?Glucose / Insulin: q4h sSSI ?BG previous 24h: 120 - 134 (SSI required 2 units) ?Electrolytes: hypokalemia ?Renal: SCr 1.79-->1.18 ?Hepatic: AST 48 / ALT wnl, INR 1.1 ?Intake / Output; MIVF: lactated ringers at 65 mL/hr ?GI Imaging: ?GI Surgeries / Procedures:  ?03/18: exploratory laparotomy ? ?Central access: PICC pending ?TPN start date: 01/01/22 (pending) ? ?Nutritional Goals: ?Goal TPN rate is 70 mL/hr (provides 100 g of protein and 1900 kcals per day) ? ?RD Assessment:  ? ?Estimated Nutritional Needs:  ?  ?Kcal:  1800-2000 ?  ?Protein:  90-105g ?  ?Fluid:  1.8L/day ?Estimated Needs ?Total Energy Estimated Needs: 1800-2000 ?Total Protein Estimated Needs: 90-105g ?Total Fluid Estimated Needs: 1.8L/day ? ?Current Nutrition:  ?NPO ? ?Plan:  ?advance TPN to 35mL/hr at 1800 (total volume including overfill 1780 mL) ?Nutritional components ?Amino acids (using 10% Travasol): 94 grams ?Dextrose: 260 grams ?Lipids (using 20% SMOFlipids): 53.8 grams ?kCal (24h): 1800  ?Electrolytes in TPN: Na 39mEq/L, K 75mEq/L, Ca 96mEq/L, Mg 69mEq/L, and Phos 65mmol/L. Cl:Ac 1:1 ?Add standard MVI and trace elements to TPN ?Adjust Sensitive SSI to q6h and adjust as needed  ?Reduce MIVF to 30 mL/hr at 1800 ?10 mEq IV KCl x 3 ?Monitor TPN labs on Mon/Thurs, daily until stable ? ?Dallie Piles ?01/02/2022,7:21 AM ? ?

## 2022-01-02 NOTE — Progress Notes (Signed)
Patient lethargic throughout shift. Patient has thick secretions. Patients SpO2 dropped mid 80's. ABG and chest xray ordered. At this time no new orders. NG hooked to LIS. Foley removed. Midline incision assessed-Per surgery will change dressing tomorrow. JP drains intact. Potassium replaced.Continue to assess.   ?

## 2022-01-02 NOTE — Progress Notes (Signed)
01/02/2022 ? ?Subjective: ?Patient is 2 Days Post-Op status post exploratory laparotomy with takedown of gastrocolic fistula and placement of new gastrostomy feeding tube.  No acute events overnight.  Patient remains off pressors with no tachycardia or hypotension.  Unable to interact much with him but he does deny any significant pain.  White blood cell count continues to improve and is down to 12.1 today from 16.1 yesterday.  His hemoglobin is also down to 9.2 but I think this is dilutional in nature rather than acute bleeding.  Creatinine has also normalized to 1.18. ? ?Vital signs: ?Temp:  [97.8 ?F (36.6 ?C)-98.5 ?F (36.9 ?C)] 98.3 ?F (36.8 ?C) (03/19 1100) ?Pulse Rate:  [36-78] 64 (03/19 1300) ?Resp:  [14-24] 24 (03/19 1300) ?BP: (83-109)/(50-67) 88/54 (03/19 1300) ?SpO2:  [88 %-97 %] 96 % (03/19 1300)  ? ?Intake/Output: ?03/18 0701 - 03/19 0700 ?In: 1534.6 [I.V.:1100.9; NG/GT:140; IV Piggyback:293.7] ?Out: 2495 [Urine:1155; Emesis/NG output:1000; Drains:340] ?Last BM Date :  (UTA) ? ?Physical Exam: ?Constitutional: No acute distress ?Abdomen: Soft, nondistended, appropriately tender to palpation.  Midline incision is clean, dry, intact with staples in place and a Penrose drain.  There are some mild serosanguineous drainage staining on the honeycomb dressing inferiorly.  Bilateral drains with serosanguineous fluid only.  Gastrostomy feeding tube in place in stable position at 4 cm to the skin and 5 cm at the bumper. ? ?Labs:  ?Recent Labs  ?  01/01/22 ?0630 01/02/22 ?0624  ?WBC 16.1* 12.1*  ?HGB 12.1* 9.2*  ?HCT 36.0* 28.1*  ?PLT 213 166  ? ?Recent Labs  ?  01/01/22 ?0630 01/02/22 ?0624  ?NA 133* 134*  ?K 4.0 3.2*  ?CL 99 99  ?CO2 25 29  ?GLUCOSE 140* 124*  ?BUN 41* 39*  ?CREATININE 1.37* 1.18  ?CALCIUM 8.2* 8.3*  ? ?Recent Labs  ?  12/31/21 ?1732  ?LABPROT 14.3  ?INR 1.1  ? ? ?Imaging: ?No results found. ? ?Assessment/Plan: ?This is a 76 y.o. male s/p exploratory laparotomy with takedown of gastrocolonic  fistula and placement of new gastrostomy feeding tube. ? ?- The patient's pain appears to be well controlled so we will continue current management.  His creatinine has now returned to normal values and so we will decrease the amount of IV fluid hydration that he is getting so we did not keep diluting his hemoglobin as much. ?- Continue IV antibiotics and antifungal for his perforation. ?- Continue n.p.o., NG tube to suction, and TPN for nutrition.  We will be planning for an upper GI study through the NG tube on Wednesday 3/22 to assess the repairs. ?- Now that his renal function has improved, we will also DC his Foley today. ?- We will recommend keeping him in the ICU still today but probably should be okay for floor status tomorrow. ?-We will start physical therapy consult for tomorrow. ? ? ?Howie Ill, MD ?[facility]  ?

## 2022-01-03 ENCOUNTER — Encounter: Payer: Self-pay | Admitting: Surgery

## 2022-01-03 DIAGNOSIS — E039 Hypothyroidism, unspecified: Secondary | ICD-10-CM | POA: Diagnosis present

## 2022-01-03 DIAGNOSIS — E440 Moderate protein-calorie malnutrition: Secondary | ICD-10-CM | POA: Diagnosis present

## 2022-01-03 DIAGNOSIS — I959 Hypotension, unspecified: Secondary | ICD-10-CM

## 2022-01-03 DIAGNOSIS — K668 Other specified disorders of peritoneum: Secondary | ICD-10-CM | POA: Diagnosis not present

## 2022-01-03 LAB — CBC
HCT: 28.3 % — ABNORMAL LOW (ref 39.0–52.0)
Hemoglobin: 9.3 g/dL — ABNORMAL LOW (ref 13.0–17.0)
MCH: 30.3 pg (ref 26.0–34.0)
MCHC: 32.9 g/dL (ref 30.0–36.0)
MCV: 92.2 fL (ref 80.0–100.0)
Platelets: 156 10*3/uL (ref 150–400)
RBC: 3.07 MIL/uL — ABNORMAL LOW (ref 4.22–5.81)
RDW: 14 % (ref 11.5–15.5)
WBC: 10.6 10*3/uL — ABNORMAL HIGH (ref 4.0–10.5)
nRBC: 0 % (ref 0.0–0.2)

## 2022-01-03 LAB — COMPREHENSIVE METABOLIC PANEL
ALT: 16 U/L (ref 0–44)
AST: 25 U/L (ref 15–41)
Albumin: 1.8 g/dL — ABNORMAL LOW (ref 3.5–5.0)
Alkaline Phosphatase: 51 U/L (ref 38–126)
Anion gap: 6 (ref 5–15)
BUN: 32 mg/dL — ABNORMAL HIGH (ref 8–23)
CO2: 27 mmol/L (ref 22–32)
Calcium: 8 mg/dL — ABNORMAL LOW (ref 8.9–10.3)
Chloride: 106 mmol/L (ref 98–111)
Creatinine, Ser: 1.05 mg/dL (ref 0.61–1.24)
GFR, Estimated: >60 mL/min (ref >60)
Glucose, Bld: 173 mg/dL — ABNORMAL HIGH (ref 70–99)
Potassium: 3.2 mmol/L — ABNORMAL LOW (ref 3.5–5.1)
Sodium: 139 mmol/L (ref 135–145)
Total Bilirubin: 0.6 mg/dL (ref 0.3–1.2)
Total Protein: 4.7 g/dL — ABNORMAL LOW (ref 6.5–8.1)

## 2022-01-03 LAB — GLUCOSE, CAPILLARY
Glucose-Capillary: 106 mg/dL — ABNORMAL HIGH (ref 70–99)
Glucose-Capillary: 143 mg/dL — ABNORMAL HIGH (ref 70–99)
Glucose-Capillary: 148 mg/dL — ABNORMAL HIGH (ref 70–99)
Glucose-Capillary: 149 mg/dL — ABNORMAL HIGH (ref 70–99)
Glucose-Capillary: 151 mg/dL — ABNORMAL HIGH (ref 70–99)
Glucose-Capillary: 166 mg/dL — ABNORMAL HIGH (ref 70–99)
Glucose-Capillary: 167 mg/dL — ABNORMAL HIGH (ref 70–99)

## 2022-01-03 LAB — PHOSPHORUS: Phosphorus: 2.2 mg/dL — ABNORMAL LOW (ref 2.5–4.6)

## 2022-01-03 LAB — MAGNESIUM: Magnesium: 1.8 mg/dL (ref 1.7–2.4)

## 2022-01-03 LAB — TRIGLYCERIDES: Triglycerides: 47 mg/dL (ref <150)

## 2022-01-03 MED ORDER — THIAMINE HCL 100 MG/ML IJ SOLN
100.0000 mg | Freq: Once | INTRAMUSCULAR | Status: AC
  Administered 2022-01-03: 100 mg via INTRAVENOUS
  Filled 2022-01-03: qty 2

## 2022-01-03 MED ORDER — SODIUM CHLORIDE 0.9 % BOLUS PEDS: 500.0000 mL | Freq: Once | INTRAVENOUS | Status: DC

## 2022-01-03 MED ORDER — SODIUM CHLORIDE 0.9 % IV BOLUS
500.0000 mL | Freq: Once | INTRAVENOUS | Status: AC
  Administered 2022-01-03: 500 mL via INTRAVENOUS

## 2022-01-03 MED ORDER — MAGNESIUM SULFATE 2 GM/50ML IV SOLN
2.0000 g | Freq: Once | INTRAVENOUS | Status: AC
  Administered 2022-01-03: 2 g via INTRAVENOUS
  Filled 2022-01-03: qty 50

## 2022-01-03 MED ORDER — NOREPINEPHRINE 4 MG/250ML-% IV SOLN
0.0000 ug/min | INTRAVENOUS | Status: DC
  Administered 2022-01-03: 2 ug/min via INTRAVENOUS
  Filled 2022-01-03: qty 250

## 2022-01-03 MED ORDER — LEVOTHYROXINE SODIUM 100 MCG/5ML IV SOLN
37.5000 ug | Freq: Every day | INTRAVENOUS | Status: DC
Start: 2022-01-04 — End: 2022-01-06
  Administered 2022-01-04 – 2022-01-06 (×3): 37.5 ug via INTRAVENOUS
  Filled 2022-01-03 (×3): qty 5

## 2022-01-03 MED ORDER — TRAVASOL 10 % IV SOLN
INTRAVENOUS | Status: AC
  Filled 2022-01-03: qty 940.8

## 2022-01-03 MED ORDER — SODIUM CHLORIDE 0.9 % IV SOLN: Freq: Once | INTRAVENOUS | Status: AC

## 2022-01-03 MED ORDER — POTASSIUM PHOSPHATES 15 MMOLE/5ML IV SOLN
20.0000 mmol | Freq: Once | INTRAVENOUS | Status: AC
  Administered 2022-01-03: 20 mmol via INTRAVENOUS
  Filled 2022-01-03: qty 6.67

## 2022-01-03 NOTE — Assessment & Plan Note (Signed)
Home Synthroid was initially held as patient is currently n.p.o. ?-Start him on IV Synthroid ?

## 2022-01-03 NOTE — Progress Notes (Signed)
Nutrition Follow Up Note  ? ?DOCUMENTATION CODES:  ? ?Non-severe (moderate) malnutrition in context of chronic illness ? ?INTERVENTION:  ? ?Continue TPN per pharmacy  ? ?Recommend thiamine 100mg  daily x 3 days  ? ?Once appropriate for tube feeds, recommend: ? ?Osmolite 1.5@55ml /hr- Initiate at 57ml/hr and increase by 67ml/hr q 8 hours until goal rate is reached.  ? ?Pro-Source 47ml BID via tube, provides 40kcal and 11g of protein per serving  ? ?Free water flushes 71ml q4 hours to maintain tube patency  ? ?Regimen provides 2060kcal/day, 105g/day protein and 1197ml/day of free water.  ? ?Pt at high refeed risk; recommend monitor potassium, magnesium and phosphorus labs daily until stable ? ?NUTRITION DIAGNOSIS:  ? ?Moderate Malnutrition related to chronic illness (MS, COPD, dysphagia) as evidenced by mild fat depletion, moderate fat depletion, moderate muscle depletion, severe muscle depletion. ? ?GOAL:  ? ?Patient will meet greater than or equal to 90% of their needs ?-met with TPN  ? ?MONITOR:  ? ?Labs, Weight trends, Skin, I & O's, Other (Comment) (TPN) ? ?ASSESSMENT:  ? ?76 y/o male with h/o MS, Barrett's esophagus, dysphagia with chronic G-tube (since 11/2020), COPD, DM, GERD and hypothyroidism who is admitted with PNA, septic shock and peritonitis secondary to dislodged G-tube s/p exploratory laparotomy 3/18 (with takedown of gastrocolonic fistula with primary stapled repair of gastrostomy site and colotomy site, placement of new open feeding gastrostomy tube) ? ?Met with pt in room today. Pt is lethargic today and is unable to provide nutrition related history. Pt receiving TPN at goal rate. Pt is currently refeeding; electrolytes being managed by pharmacy. NGT in place with 33ml output. JP drain with 266ml output. UOP 1935ml. Per MD note, plan is for UG series 3/22. Will plan to initiate tube feeds once approved by surgery. RD unsure of what pt's home tube feed regimen is. No new weight this admission; will  order daily weights. There is limited weight history in chart; pt does have one documented weight from May 2022 of 148lbs. It appears pt has lost 28lbs(19%) since May; RD unsure if this is correct as pt with G-tube and should be receiving home feeds. RD will follow up with pt to try and obtain nutrition related history once pt is more alert.  ? ?Medications reviewed and include: lovenox, insulin, protonix, diflucan, Mg sulfate, zosyn , Kphos ? ?Labs reviewed: Na 139 wnl, K 3.2(L), BUN 32(H), P 2.2(L), Mg 1.8 wnl ?Triglycerides- 47 ?Wbc- 10.6(H), Hgb 9.3(L), Hct 28.3(L) ?Cbgs- 149, 148, 166, 167 x 24 hrs ? ?NUTRITION - FOCUSED PHYSICAL EXAM: ? ?Flowsheet Row Most Recent Value  ?Orbital Region Mild depletion  ?Upper Arm Region Moderate depletion  ?Thoracic and Lumbar Region Moderate depletion  ?Buccal Region Mild depletion  ?Temple Region Moderate depletion  ?Clavicle Bone Region Mild depletion  ?Clavicle and Acromion Bone Region Mild depletion  ?Scapular Bone Region Moderate depletion  ?Dorsal Hand Moderate depletion  ?Patellar Region Severe depletion  ?Anterior Thigh Region Severe depletion  ?Posterior Calf Region Severe depletion  ?Edema (RD Assessment) Mild  ?Hair Reviewed  ?Eyes Reviewed  ?Mouth Reviewed  ?Skin Reviewed  ?Nails Reviewed  ? ?Diet Order:   ?Diet Order   ? ?       ?  Diet NPO time specified  Diet effective now       ?  ? ?  ?  ? ?  ? ?EDUCATION NEEDS:  ? ?Not appropriate for education at this time ? ?Skin:  Skin Assessment: Skin Integrity Issues: ?  Skin Integrity Issues:: Incisions ?Incisions: 3/18 abdomen ? ?Last BM:  PTA ? ?Height:  ? ?Ht Readings from Last 1 Encounters:  ?12/29/21 $RemoveBe'5\' 9"'[id number]$  (1.753 m)  ? ? ?Weight:  ? ?Wt Readings from Last 1 Encounters:  ?12/29/21 54.4 kg  ? ?BMI:  There is no height or weight on file to calculate BMI. ? ?Estimated Nutritional Needs:  ? ?Kcal:  1800-2000kcal/day  ? ?Protein:  90-105g/day  ? ?Fluid:  1.4-1.6L/day ? ?Koleen Distance MS, RD, LDN ?Please refer to AMION for  RD and/or RD on-call/weekend/after hours pager ? ?

## 2022-01-03 NOTE — Progress Notes (Signed)
01/03/2022 ? ?Subjective: ?Patient is 3 Days Post-Op s/p exploratory laparotomy with takedown of gastrocolic fistula and placement of new gastrostomy feeding tube. Patient was more somnolent yesterday with hypoxia.  CXR done showed new LLL pneumonia.  This morning, O2 level is improved but is on HFNC at 44% FiO2 and 45 L/min.  When trying talk to him, he nods yes or no to questions.  Denies any significant pain or nausea. ? ?Vital signs: ?Temp:  [98.3 ?F (36.8 ?C)-99.6 ?F (37.6 ?C)] 98.3 ?F (36.8 ?C) (03/20 0720) ?Pulse Rate:  [62-69] 62 (03/20 0700) ?Resp:  [16-31] 27 (03/20 0700) ?BP: (83-104)/(51-59) 94/58 (03/20 0700) ?SpO2:  [90 %-100 %] 100 % (03/20 0720) ?FiO2 (%):  [44 %-60 %] 44 % (03/20 0720)  ? ?Intake/Output: ?03/19 0701 - 03/20 0700 ?In: 2909.5 [I.V.:2561.8; IV Piggyback:347.7] ?Out: [id number] [Urine:1250; Emesis/NG output:25; Drains:280] ?Last BM Date :  (UTA) ? ?Physical Exam: ?Constitutional: No acute distress, but somnolent ?Abdomen:  soft, non-distended, does not appear to be tender to palpation.  Bilateral drains with serosanguinous fluid.  NG to suction with gastric contents with mild dark brown tones, no active bleeding.  G tube clamped. ? ?Labs:  ?Recent Labs  ?  01/02/22 ?0624 01/03/22 ?[id number]  ?WBC 12.1* 10.6*  ?HGB 9.2* 9.3*  ?HCT 28.1* 28.3*  ?PLT 166 156  ? ?Recent Labs  ?  01/02/22 ?0624 01/03/22 ?[id number]  ?NA 134* 139  ?K 3.2* 3.2*  ?CL 99 106  ?CO2 29 27  ?GLUCOSE 124* 173*  ?BUN 39* 32*  ?CREATININE 1.18 1.05  ?CALCIUM 8.3* 8.0*  ? ?Recent Labs  ?  12/31/21 ?1732  ?LABPROT 14.3  ?INR 1.1  ? ? ?Imaging: ?DG Chest Port 1 View ? ?Result Date: 01/02/2022 ?CLINICAL DATA:  Recent exploratory laparotomy for malpositioned gastrostomy tube and gastrocolonic fistula. EXAM: PORTABLE CHEST 1 VIEW COMPARISON:  CT chest dated December 31, 2021. Chest x-ray dated June 20, 2021. FINDINGS: Right upper extremity PICC line with tip in the mid SVC. Enteric tube in the stomach. Gastrostomy tube partially visualized.  The heart size and mediastinal contours are within normal limits. Normal pulmonary vascularity. New patchy consolidation in the left lower lobe. Unchanged left lower lobe calcified granulomas. No pleural effusion or pneumothorax. Chronic displaced fracture of the left humeral neck again noted. IMPRESSION: 1. New left lower lobe pneumonia. Electronically Signed   By: Titus Dubin M.D.   On: 01/02/2022 17:49   ? ?Assessment/Plan: ?This is a 76 y.o. male s/p exploratory laparotomy with takedown of gastrocolic fistula and placement of new gastrostomy feeding tube ? ?--Patient's O2 sat better but with additional support of HFNC.  That has been weaned a bit since it started.  WBC today is improving to 10.6 with stable Hgb and improving Cr.   ?--Continue NPO and TPN for now.  Would be waiting until 3/22 to obtain UGI study to evaluate the gastric repair.  Depending on his mental status, may need SLP to eval for swallow afterwards if able to clear from repair standpoint. ?--Continue IV Zosyn and Fluconazole for his perf/leak and now pneumonia. ? ? ?Melvyn Neth, MD ?[facility]  ?

## 2022-01-03 NOTE — Assessment & Plan Note (Signed)
Patient with softer blood pressure with MAP of 57 when seen today. ?-Gave him 500 cc bolus-resulted in improvement of blood pressure. ?-Continue to monitor-might need to restart pressors ?

## 2022-01-03 NOTE — Progress Notes (Signed)
PT Cancellation Note ? ?Patient Details ?Name: MARIBEL LUIS ?MRN: [id number] ?DOB: 08-09-1946 ? ? ?Cancelled Treatment:    Reason Eval/Treat Not Completed: Medical issues which prohibited therapy. Patient with low BP, increased respiratory rate, on HFNC, not yet medically ready for PT Eval. Will continue to follow and evaluate when appropriate.  ? ? ?Zavior Thomason ?01/03/2022, 11:19 AM ?

## 2022-01-03 NOTE — Progress Notes (Signed)
Patient received NS bolus x 2 today for low MAP's with intermittent temporary improvement. Dilaudid administered once, for complaints of pain, and did experience improvement as indicated by affirmative nod, but had decreased BP.  ?Discussed with Dr. Nelson Chimes, and patient started on Levo at 6:20 pm. Has received IV antibiotics, Flagyl, and Zosyn. Also received Kphos 500 ml infusion, Po4 2.2, and K 3.2 and magnesium sulfate 2 gm for Mg 1.8. Follows commands, but unable to understand his attempts at verbalization. Occasionally moans. Has weak cough, thick secretions in back of throat. Frequently suctioned orally and given mouthcare. O2 Heated HFNC, 45 liters, and 45%. Breath sounds very congested with rhonci.  ? ?Patient's son visited briefly around 6:30 pm.  ?

## 2022-01-03 NOTE — Progress Notes (Signed)
PHARMACY - TOTAL PARENTERAL NUTRITION CONSULT NOTE  ? ?Indication: Intolerance to enteral feeding ? ?Assessment: 76 yo M w/ PMH of  COPD, multiple sclerosis, diabetes, Barrett esophagus presenting to [facility] ED from home with complaints of abdominal pain in the setting of a suspected clogged PEG tube and coffee ground emesis since Thurs night 12/30/21.  ? ?Glucose / Insulin: q6h sSSI ?BG previous 24h: 121 - 173 (SSI required 6 units) ?Electrolytes: Mild hypokalemia, mild hypophosphatemia, magnesium at LLN ?Renal: AKI resolved. Scr 1.05 ?Hepatic: Within normal limits ?Intake / Output; MIVF: LR at 30 cc/hr ?GI Imaging: ?GI Surgeries / Procedures:  ?03/18: Exploratory laparotomy ? ?Central access: PICC placed 01/01/22 ?TPN start date: 01/01/22 ? ?Nutritional Goals: ?Goal TPN rate is 70 mL/hr (provides 100 g of protein and 1900 kcals per day) ? ?RD Assessment:  ?Estimated Needs ?Total Energy Estimated Needs: 1800-2000 ?Total Protein Estimated Needs: 90-105g ?Total Fluid Estimated Needs: 1.8L/day ? ?Current Nutrition:  ?NPO ? ?Plan:  ?Continue TPN at 70 mL/hr at 1800 (total volume including overfill 1780 mL) ?Nutritional components ?Amino acids (using 10% Travasol): 94 grams ?Dextrose: 260 grams ?Lipids (using 20% SMOFlipids): 53.8 grams ?kCal (24h): 1800  ?Electrolytes in TPN: Na 28mEq/L, K 30mEq/L, Ca 80mEq/L, Mg 25mEq/L, and Phos 32mmol/L. Cl:Ac 1:1 ?IV potassium phosphate 20 mmol x 1 ?IV magnesium sulfate 2 g x 1 ?Add standard MVI and trace elements to TPN, IV thiamine x 3 days ?Continue Sensitive SSI at q6h and adjust as needed  ?Continue MIVF to 30 mL/hr at 1800; fluid goals per surgery / primary team ?Monitor TPN labs on Mon/Thurs, daily until stable ? ?Tressie Ellis ?01/03/2022,8:04 AM ? ?

## 2022-01-03 NOTE — Hospital Course (Addendum)
Taken from prior notes. ? ?76 yo M with past medical history of multiple sclerosis, Barrett esophagus, dysphagia s/p PEG tube placement 11/2020, T2DM, COPD, hypothyroid, GERD, overactive bladder, as reviewed from EMR, presented at [facility] ED from home with complaints of abdominal pain in the setting of a suspected clogged PEG tube and coffee ground emesis since Thurs night 12/30/21. Patient had come to the ED on 12/29/21 because his PEG tube had become dislodged. Per chart review it appears as though the PEG tube was replaced bedside, no confirmatory imaging noted. Per Dr. Mont Dutton documentation & via telephone discussion with the patient's son, abdominal pain that began the evening of 12/29/21. The patient's son reported this abdominal pain worsened on 12/30/21 and then the patient began having coffee-ground emesis on the morning of 12/31/21. Per son, the patient has been able to take all his medications as prescribed. ?  ?ED course: ?Upon arrival the patient was hypotensive & tachycardic, spitting up foamy phlegm with coffee-ground material. CT imaging shows malposition of the PEG tube in the peritoneal cavity, not inside the intestine/stomach.  Pneumoperitoneum was also noted.  Surgery was contacted for admission and emergent intervention. ? ?Patient was taken to the OR for exploratory laparotomy on 12/31/2021.  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  Patient was taken to ICU postoperatively due to peritonitis and possible for perforation. ? ?He is currently weaned off from pressors. ? ?Worsening respiratory status, currently on heated high flow at 45 L of oxygen. ? ?He was also placed on TPN until bowel functions resume. ?He is currently on Zosyn and Diflucan. ?Palliative care was also consulted to discuss goals of care. ?Very high risk for deterioration and death. ? ?2023/01/21; patient was started on low-dose  Levophed due to persistent hypotension not responding to IV fluid.  Currently blood pressure stable.  Clinically seems the same.  General surgery is going to do GI series tomorrow via NG tube.  Palliative care was also consulted and a family meeting is planned for tomorrow around noon.  We will continue current level of care at this time. ? ?3/22: Patient had upper GI series which was negative for any leak, surgery is planning to start trickle feed through G-tube. ?Palliative care meeting with family, patient is not DNR with full scope of medical care.  If he deteriorates then they are open to discuss about comfort care but would like to continue current level of care at this time. ?Remains very lethargic but off the pressors now, able to wean off to 2 L of oxygen. ? ?3/23: Clinically seems improving.  Still feeling weak.  Able to wean off to room air now. ?There was some trouble getting TPN from [facility], currently on trickle enteric feed and will reach his goal feeding by tomorrow.  Also having some mild diarrhea. ? ?3/24: Nursing concern of excessive upper respiratory thick secretions, requiring frequent suctioning.  Does not like a vibrating bed for chest PT. Conniff is seen added. ?Surgery to progress to goal rate of tube feed today. ?We will continue holding TPN at this time. ?PT/OT evaluation for a potential discharge in next couple of days. ? ?Later informed by nursing staff that patient developed a transient episode of atrial fibrillation, EKG was obtained which is consistent with rate controlled atrial fibrillation.  Patient reverted back to sinus rhythm spontaneously after a very short time.  We will monitor at this time. ? ?  3/25: Patient did develop another episode of hypotension yesterday evening requiring 1 L of bolus with improvement in blood pressure.  Worsening leukocytosis and low-grade fever this morning.  Patient continued to scream help whenever awake and resting in between. ?Repeat CT with  some concern of worsening bilateral pleural effusion and new groundglass opacities in the upper lobe, broad differential which include aspiration pneumonia, patient is already on antibiotics.  CT abdomen improving and complete resolution of prior pneumoperitoneum. ?Checking procalcitonin. ? ?Patient developed another febrile episode with temperature of 101.6.  Checking blood and urine cultures, checking respiratory cultures.  Discussed with son to revisit the goals of care, he will discuss with his dad and come back to [place]. ? ?Patient is very high risk for deterioration.  Will request palliative care to revisit as family might have changed their perspective. ? ?3/26: Patient continued to decline.  Becoming hypotensive and febrile again.  Repeat blood cultures remain negative, respiratory culture and urine cultures pending.  Will check MRSA swab and it came back positive.  CT chest which was done yesterday with some concern of new bilateral groundglass opacities with a broad differential which include aspiration pneumonia versus an atypical infection.  He received last dose of Zosyn yesterday. ?Also had another discussion with son as he might be appropriate for comfort care. ?Giving some more boluses-if family decided to continue with medical care, he needs to be transferred back to ICU for pressors. ?Starting him on Unasyn, Zithromax and vancomycin. ?Patient also has diarrhea since started on tube feed, unable to explain any worsening of abdominal pain, always says yes to pain, worsening leukocytosis and new fever.  He was critically sick and received Zosyn for many days-high risk for C. difficile colitis-message sent to ID to proceed with C. difficile testing if appropriate. ? ?Family decided to make him comfort care only and withdrawing all the treatment on 01/09/2022. ?Comfort care measures started and antibiotics were discontinued. ? ?Family decided to move him to hospice home in [place]. ?TOC to send referral  for inpatient hospice care. ? ?3/28: Patient appears comfortable and remained stable.  There was no bed availability at hospice home today.  We will continue to monitor and transfer him to hospice home if remains stable and have a bed. ?Patient started moaning when I called his name, I told nurse to liberalize pain medications. ?Apparently he was just getting oxycodone, Dilaudid is on board and should be given to keep him comfortable. ? ?3/29: Patient remained stable and comfortable.  Still no bed availability at hospice home. ? ?3/30: Patient remained stable.  Only responding to painful stimuli.  Still no bed availability. ?

## 2022-01-03 NOTE — Progress Notes (Addendum)
?Progress Note ? ? ?PatientMarland Parks ROXY FILLER [id number] DOB: 1946/08/29 DOA: 12/31/2021     3 ?DOS: the patient was seen and examined on 01/03/2022 ?  ?Brief hospital course: ?Taken from prior notes. ? ?76 yo M with past medical history of multiple sclerosis, Barrett esophagus, dysphagia s/p PEG tube placement 11/2020, T2DM, COPD, hypothyroid, GERD, overactive bladder, as reviewed from EMR, presented at [facility] ED from home with complaints of abdominal pain in the setting of a suspected clogged PEG tube and coffee ground emesis since Thurs night 12/30/21. Patient had come to the ED on 12/29/21 because his PEG tube had become dislodged. Per chart review it appears as though the PEG tube was replaced bedside, no confirmatory imaging noted. Per Dr. Adelene Idler documentation & via telephone discussion with the patient's son, abdominal pain that began the evening of 12/29/21. The patient's son reported this abdominal pain worsened on 12/30/21 and then the patient began having coffee-ground emesis on the morning of 12/31/21. Per son, the patient has been able to take all his medications as prescribed. ?  ?ED course: ?Upon arrival the patient was hypotensive & tachycardic, spitting up foamy phlegm with coffee-ground material. CT imaging shows malposition of the PEG tube in the peritoneal cavity, not inside the intestine/stomach.  Pneumoperitoneum was also noted.  Surgery was contacted for admission and emergent intervention. ? ?Patient was taken to the OR for exploratory laparotomy on 12/31/2021.  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  Patient was taken to ICU postoperatively due to peritonitis and possible for perforation. ? ?He is currently weaned off from pressors. ? ?Worsening respiratory status, currently on heated high flow at 45 L of oxygen. ? ?He was also placed on TPN until bowel functions resume. ?He is  currently on Zosyn and Diflucan. ?Palliative care was also consulted to discuss goals of care. ?Very high risk for deterioration and death. ? ? ?Assessment and Plan: ?* Pneumoperitoneum ?Secondary to dislodged intraperitoneal gastrostomy tube s/p exploratory laparotomy, repair, insertion of new gastrostomy tube and takedown of gastrocolonic fistula. ?Patient remained on TPN until resume bowel function. ?-Continue with TPN ?-Continue with Zosyn and Diflucan-being managed by surgery ?-Continue with PPI IV twice daily. ?-Continue with supportive care ? ? ?Hypotension ?Patient with softer blood pressure with MAP of 57 when seen today. ?-Gave him 500 cc bolus-resulted in improvement of blood pressure. ?-Continue to monitor-might need to restart pressors ? ?Hypothyroidism ?Home Synthroid was initially held as patient is currently n.p.o. ?-Start him on IV Synthroid ? ?Malnutrition of moderate degree ?Estimated body mass index is 17.72 kg/m? as calculated from the following: ?  Height as of 12/29/21: 5\' 9"  (1.753 m). ?  Weight as of 12/29/21: 54.4 kg.  ? ?-Currently on TPN ? ?  ? ?Subjective: Patient looks miserable when seen today, complaining of abdominal pain, a lot of upper respiratory secretions and tachypnea.  Nodding yes or no only. ? ?Physical Exam: ?Vitals:  ? 01/03/22 1430 01/03/22 1500 01/03/22 1531 01/03/22 1600  ?BP: (!) 104/57  (!) 103/51 (!) 91/47  ?Pulse: 68 62 63 (!) 57  ?Resp: (!) 25 (!) 26 (!) 28 (!) 27  ?Temp:    98.5 ?F (36.9 ?C)  ?TempSrc:    Axillary  ?SpO2: 93% 100% 92% 93%  ? ?General. Lethargic and frail, appears in pain. ?Pulmonary.  A lot of transmitted upper respiratory sounds, tachypneic, mildly increased work of breathing. ?CV.  Regular rate  and rhythm, no JVD, rub or murmur. ?Abdomen.  Soft, mild diffuse tenderness, gastrostomy tube and bandage in place, nondistended, BS hypoactive. ?CNS. Lethargic, no apparent focal deficit. ?Extremities.  No edema, no cyanosis, pulses intact and  symmetrical. ?Psychiatry.  Judgment and insight appears impaired. ? ?Data Reviewed: ?I personally reviewed prior data, labs and images ? ?Family Communication: Discussed with son on phone. ? ?Disposition: ?Status is: Inpatient ?Remains inpatient appropriate because: Severity of illness. ? ? Planned Discharge Destination:  To be determined . ? ?DVT prophylaxis.  Lovenox ? ?Time spent: 50 minutes ? ?This record has been created using Conservation officer, historic buildings. Errors have been sought and corrected,but may not always be located. Such creation errors do not reflect on the standard of care. ? ?Author: ?Arnetha Courser, MD ?01/03/2022 5:01 PM ? ?For on call review [email or web address].  ?

## 2022-01-03 NOTE — Assessment & Plan Note (Signed)
Estimated body mass index is 17.72 kg/m? as calculated from the following: ?  Height as of 12/29/21: 5\' 9"  (1.753 m). ?  Weight as of 12/29/21: 54.4 kg.  ? ?-Currently on TPN ?

## 2022-01-03 NOTE — Assessment & Plan Note (Signed)
Secondary to dislodged intraperitoneal gastrostomy tube s/p exploratory laparotomy, repair, insertion of new gastrostomy tube and takedown of gastrocolonic fistula. ?Patient remained on TPN until resume bowel function. ?-Continue with TPN ?-Continue with Zosyn and Diflucan-being managed by surgery ?-Continue with PPI IV twice daily. ?-Continue with supportive care ? ?

## 2022-01-04 DIAGNOSIS — K668 Other specified disorders of peritoneum: Secondary | ICD-10-CM | POA: Diagnosis not present

## 2022-01-04 LAB — GLUCOSE, CAPILLARY
Glucose-Capillary: 152 mg/dL — ABNORMAL HIGH (ref 70–99)
Glucose-Capillary: 148 mg/dL — ABNORMAL HIGH (ref 70–99)
Glucose-Capillary: 136 mg/dL — ABNORMAL HIGH (ref 70–99)

## 2022-01-04 LAB — BASIC METABOLIC PANEL
Anion gap: 7 (ref 5–15)
BUN: 29 mg/dL — ABNORMAL HIGH (ref 8–23)
CO2: 26 mmol/L (ref 22–32)
Calcium: 7.7 mg/dL — ABNORMAL LOW (ref 8.9–10.3)
Chloride: 105 mmol/L (ref 98–111)
Creatinine, Ser: 0.79 mg/dL (ref 0.61–1.24)
GFR, Estimated: >60 mL/min (ref >60)
Glucose, Bld: 155 mg/dL — ABNORMAL HIGH (ref 70–99)
Potassium: 3.2 mmol/L — ABNORMAL LOW (ref 3.5–5.1)
Sodium: 138 mmol/L (ref 135–145)

## 2022-01-04 LAB — MAGNESIUM: Magnesium: 1.7 mg/dL (ref 1.7–2.4)

## 2022-01-04 LAB — PHOSPHORUS: Phosphorus: 2.8 mg/dL (ref 2.5–4.6)

## 2022-01-04 MED ORDER — POTASSIUM CHLORIDE 10 MEQ/100ML IV SOLN
10.0000 meq | INTRAVENOUS | Status: AC
  Administered 2022-01-04 (×3): 10 meq via INTRAVENOUS
  Filled 2022-01-04 (×3): qty 100

## 2022-01-04 MED ORDER — SODIUM CHLORIDE 0.9 % IV SOLN
INTRAVENOUS | Status: DC | PRN
Start: 2022-01-04 — End: 2022-01-15

## 2022-01-04 MED ORDER — TRAVASOL 10 % IV SOLN
INTRAVENOUS | Status: AC
  Filled 2022-01-04: qty 940.8

## 2022-01-04 MED ORDER — FLUCONAZOLE IN SODIUM CHLORIDE 400-0.9 MG/200ML-% IV SOLN
400.0000 mg | INTRAVENOUS | Status: AC
  Administered 2022-01-05 – 2022-01-06 (×2): 400 mg via INTRAVENOUS
  Filled 2022-01-04 (×3): qty 200

## 2022-01-04 MED ORDER — FLUCONAZOLE IN SODIUM CHLORIDE 200-0.9 MG/100ML-% IV SOLN
200.0000 mg | Freq: Once | INTRAVENOUS | Status: AC
  Administered 2022-01-04: 200 mg via INTRAVENOUS
  Filled 2022-01-04: qty 100

## 2022-01-04 MED ORDER — MAGNESIUM SULFATE 2 GM/50ML IV SOLN
2.0000 g | Freq: Once | INTRAVENOUS | Status: AC
  Administered 2022-01-04: 2 g via INTRAVENOUS
  Filled 2022-01-04: qty 50

## 2022-01-04 NOTE — Assessment & Plan Note (Signed)
Estimated body mass index is 17.72 kg/m? as calculated from the following: ?  Height as of 12/29/21: 5' 9" (1.753 m). ?  Weight as of 12/29/21: 54.4 kg.  ? ?-Currently on TPN ?

## 2022-01-04 NOTE — Evaluation (Signed)
Physical Therapy Evaluation ?Patient Details ?Name: Mike Parks ?MRN: [id number] ?DOB: Jun 04, 1946 ?Today's Date: 01/04/2022 ? ?History of Present Illness ? Pt is a 76 yo M with past medical history of multiple sclerosis, Barrett esophagus, dysphagia s/p PEG tube placement 11/2020, DM, COPD, hypothyroid, GERD, and overactive bladder who presented at [facility] ED from home with complaints of abdominal pain.  Pt diagnosed with free air and free fluid due to malposition of feeding tube placed in the peritoneal cavity but outside of the intestine or stomach. Pt now s/p exploratory laparotomy with takedown of gastrocolic fistula and placement of new gastrostomy feeding tube. ?  ?Clinical Impression ? Pt awake and alert but very weak.  Pt was able to follow commands during the session but had difficulty with verbal expression with even soft yes or no answers to questions requiring significant effort. Unable to obtain a history or PLOF secondary to pt's difficulty communicating verbally with no family available to assist. Pt put forth good effort with below therex but required significant effort with all movements.  Pt required heavy +2 assist with bed mobility tasks but was able to maintain static sitting position at the EOB with only BUE support, no physical assist needed. Pt communicated with head nods that he did not feel up to attempting to stand this session.  Of note, pt found on 40L HFNC down from recent 45L with SpO2 WNL throughout the session. Pt will benefit from PT services in a SNF setting upon discharge to safely address deficits listed in patient problem list for decreased caregiver assistance and eventual return to PLOF. ?        ?   ? ?Recommendations for follow up therapy are one component of a multi-disciplinary discharge planning process, led by the attending physician.  Recommendations may be updated based on patient status, additional functional criteria and insurance authorization. ? ?Follow Up  Recommendations Skilled nursing-short term rehab (<3 hours/day) ? ?  ?Assistance Recommended at Discharge Frequent or constant Supervision/Assistance  ?Patient can return home with the following ? Two people to help with walking and/or transfers;Two people to help with bathing/dressing/bathroom;Direct supervision/assist for medications management;Assistance with cooking/housework;Assist for transportation;Help with stairs or ramp for entrance ? ?  ?Equipment Recommendations Other (comment) (TBD at next venue of care)  ?Recommendations for Other Services ?    ?  ?Functional Status Assessment Patient has had a recent decline in their functional status and demonstrates the ability to make significant improvements in function in a reasonable and predictable amount of time.  ? ?  ?Precautions / Restrictions Precautions ?Precautions: Fall ?Restrictions ?Weight Bearing Restrictions: No  ? ?  ? ?Mobility ? Bed Mobility ?Overal bed mobility: Needs Assistance ?Bed Mobility: Rolling, Sidelying to Sit, Sit to Sidelying ?Rolling: Max assist ?Sidelying to sit: +2 for physical assistance, Max assist ?  ?  ?Sit to sidelying: Max assist, +2 for physical assistance ?General bed mobility comments: +2 assist for BLE and trunk control ?  ? ?Transfers ?  ?  ?  ?  ?  ?  ?  ?  ?  ?General transfer comment: Pt declined to attempt to stand secondary to fatigue ?  ? ?Ambulation/Gait ?  ?  ?  ?  ?  ?  ?  ?  ? ?Stairs ?  ?  ?  ?  ?  ? ?Wheelchair Mobility ?  ? ?Modified Rankin (Stroke Patients Only) ?  ? ?  ? ?Balance Overall balance assessment: Needs assistance ?Sitting-balance support: Feet unsupported,  Bilateral upper extremity supported ?Sitting balance-Leahy Scale: Poor ?Sitting balance - Comments: Pt required UE support on the bed rail to maintain static sitting position ?  ?  ?  ?  ?  ?  ?  ?  ?  ?  ?  ?  ?  ?  ?  ?   ? ? ? ?Pertinent Vitals/Pain Pain Assessment ?Breathing: normal ?Negative Vocalization: none ?Facial Expression: smiling  or inexpressive ?Body Language: relaxed ?Consolability: no need to console ?PAINAD Score: 0  ? ? ?Home Living Family/patient expects to be discharged to:: Unsure ?  ?  ?  ?  ?  ?  ?  ?  ?  ?Additional Comments: Unkown, pt unable to speak well enough to provide any detailed history  ?  ?Prior Function   ?  ?  ?  ?  ?  ?  ?  ?  ?  ? ? ?Hand Dominance  ?   ? ?  ?Extremity/Trunk Assessment  ? Upper Extremity Assessment ?Upper Extremity Assessment: Generalized weakness ?  ? ?Lower Extremity Assessment ?Lower Extremity Assessment: Generalized weakness ?  ? ?   ?Communication  ? Communication: Expressive difficulties  ?Cognition Arousal/Alertness: Lethargic ?Behavior During Therapy: Flat affect ?Overall Cognitive Status: Difficult to assess ?  ?  ?  ?  ?  ?  ?  ?  ?  ?  ?  ?  ?  ?  ?  ?  ?General Comments: Pt very weak with difficulty speaking ?  ?  ? ?  ?General Comments   ? ?  ?Exercises Total Joint Exercises ?Heel Slides: AAROM, Strengthening, Both, 5 reps ?Hip ABduction/ADduction: AAROM, Strengthening, Both, 5 reps ?Straight Leg Raises: AAROM, Strengthening, Both, 5 reps ?Long Arc Quad: AROM, Strengthening, Both, 5 reps, 10 reps ?Knee Flexion: AROM, Strengthening, Both, 5 reps, 10 reps ?Other Exercises ?Other Exercises: Static sitting at EOB with BUE support for increased activity tolerance  ? ?Assessment/Plan  ?  ?PT Assessment Patient needs continued PT services  ?PT Problem List Decreased strength;Decreased activity tolerance;Decreased balance;Decreased mobility ? ?   ?  ?PT Treatment Interventions DME instruction;Gait training;Stair training;Functional mobility training;Therapeutic activities;Therapeutic exercise;Balance training;Patient/family education   ? ?PT Goals (Current goals can be found in the Care Plan section)  ?Acute Rehab PT Goals ?PT Goal Formulation: Patient unable to participate in goal setting ?Time For Goal Achievement: 01/17/22 ?Potential to Achieve Goals: Fair ? ?  ?Frequency Min 2X/week ?   ? ? ?Co-evaluation   ?  ?  ?  ?  ? ? ?  ?AM-PAC PT "6 Clicks" Mobility  ?Outcome Measure Help needed turning from your back to your side while in a flat bed without using bedrails?: Total ?Help needed moving from lying on your back to sitting on the side of a flat bed without using bedrails?: Total ?Help needed moving to and from a bed to a chair (including a wheelchair)?: Total ?Help needed standing up from a chair using your arms (e.g., wheelchair or bedside chair)?: Total ?Help needed to walk in hospital room?: Total ?Help needed climbing 3-5 steps with a railing? : Total ?6 Click Score: 6 ? ?  ?End of Session Equipment Utilized During Treatment: Oxygen ?Activity Tolerance: Patient tolerated treatment well ?Patient left: in bed;with call bell/phone within reach;with nursing/sitter in room ?Nurse Communication: Mobility status ?PT Visit Diagnosis: Muscle weakness (generalized) (M62.81);Difficulty in walking, not elsewhere classified (R26.2) ?  ? ?Time: [id number] ?PT Time Calculation (min) (ACUTE ONLY): 29 min ? ? ?Charges:  PT Evaluation ?$PT Eval Moderate Complexity: 1 Mod ?PT Treatments ?$Therapeutic Exercise: 8-22 mins ?  ?   ? ? ?D. Royetta Asal PT, DPT ?01/04/22, 3:19 PM ? ?

## 2022-01-04 NOTE — Progress Notes (Addendum)
Palliative-  ? ?Consult received- chart reviewed- spoke with patient's sonOnalee Parks. Plan to meet in patient's room at 12N tomorrow.  ? ?Ocie Bob, AGNP-C ?Palliative Medicine ? ?No charge ?

## 2022-01-04 NOTE — Progress Notes (Signed)
PHARMACY - TOTAL PARENTERAL NUTRITION CONSULT NOTE  ? ?Indication: Intolerance to enteral feeding ? ?Assessment: 76 yo M w/ PMH of  COPD, multiple sclerosis, diabetes, Barrett esophagus presenting to [facility] ED from home with complaints of abdominal pain in the setting of a suspected clogged PEG tube and coffee ground emesis since Thurs night 12/30/21.  ? ?Glucose / Insulin: q6h sSSI ?BG previous 24h: 143 - 155 (SSI required 6 units) ?Electrolytes: Mild hypokalemia, mild hypomagnesemia ?Renal: AKI resolved. Scr 0.79 ?Hepatic: Within normal limits ?Intake / Output; MIVF: LR at 30 cc/hr ?GI Imaging: ?GI Surgeries / Procedures:  ?03/18: Exploratory laparotomy ? ?Central access: PICC placed 01/01/22 ?TPN start date: 01/01/22 ? ?Nutritional Goals: ?Goal TPN rate is 70 mL/hr (provides 100 g of protein and 1900 kcals per day) ? ?RD Assessment:  ?Estimated Needs ?Total Energy Estimated Needs: 1800-2000 ?Total Protein Estimated Needs: 90-105g ?Total Fluid Estimated Needs: 1.4-1.6L/day ? ?Current Nutrition:  ?NPO ? ?Plan:  ?Continue TPN at 70 mL/hr at 1800 (total volume including overfill 1780 mL) ?Nutritional components ?Amino acids (using 10% Travasol): 94 grams ?Dextrose: 260 grams ?Lipids (using 20% SMOFlipids): 53.8 grams ?kCal (24h): 1800  ?Electrolytes in TPN: Na 49mEq/L, K 98mEq/L, Ca 29mEq/L, Mg 82mEq/L, and Phos 15mmol/L. Cl:Ac 1:1 ?Give IV KCl 39mEq x 3 runs ?Add standard MVI and trace elements to TPN, IV thiamine x 3 days ?Continue Sensitive SSI at q6h and adjust as needed  ?Continue MIVF to 30 mL/hr at 1800; fluid goals per surgery / primary team ?Monitor TPN labs on Mon/Thurs, daily until stable ? ?Herby Abraham, PharmD Candidate ?01/04/2022,7:42 AM ? ?

## 2022-01-04 NOTE — Plan of Care (Signed)
ANOx1. Confused. Pt requiring frequent suctioning. Mild serosang drainage from penrose drain on midline incision. Pt complained of 10/10 pain; PAINAID positive; PRN dilaudid given to good effect. SB. Afebrile. MAP>65; not requiring vasopressors overnight ?Problem: Education: ?Goal: Knowledge of General Education information will improve ?Description: Including pain rating scale, medication(s)/side effects and non-pharmacologic comfort measures ?Outcome: Progressing ?  ?Problem: Health Behavior/Discharge Planning: ?Goal: Ability to manage health-related needs will improve ?Outcome: Progressing ?  ?Problem: Clinical Measurements: ?Goal: Ability to maintain clinical measurements within normal limits will improve ?Outcome: Progressing ?Goal: Will remain free from infection ?Outcome: Progressing ?Goal: Diagnostic test results will improve ?Outcome: Progressing ?Goal: Respiratory complications will improve ?Outcome: Progressing ?Goal: Cardiovascular complication will be avoided ?Outcome: Progressing ?  ?Problem: Activity: ?Goal: Risk for activity intolerance will decrease ?Outcome: Progressing ?  ?Problem: Nutrition: ?Goal: Adequate nutrition will be maintained ?Outcome: Progressing ?  ?Problem: Coping: ?Goal: Level of anxiety will decrease ?Outcome: Progressing ?  ?Problem: Elimination: ?Goal: Will not experience complications related to bowel motility ?Outcome: Progressing ?Goal: Will not experience complications related to urinary retention ?Outcome: Progressing ?  ?Problem: Pain Managment: ?Goal: General experience of comfort will improve ?Outcome: Progressing ?  ?Problem: Safety: ?Goal: Ability to remain free from injury will improve ?Outcome: Progressing ?  ?Problem: Skin Integrity: ?Goal: Risk for impaired skin integrity will decrease ?Outcome: Progressing ?  ?

## 2022-01-04 NOTE — Assessment & Plan Note (Addendum)
Secondary to dislodged intraperitoneal gastrostomy tube s/p exploratory laparotomy, repair, insertion of new gastrostomy tube and takedown of gastrocolonic fistula. ?Patient remained on TPN until resume bowel function. ?-Continue with TPN ?-Continue with Zosyn and Diflucan-being managed by surgery, they wanted for 7 days. ?-Continue with PPI IV twice daily. ?-Continue with supportive care ?-GI series scheduled for tomorrow ? ?

## 2022-01-04 NOTE — Assessment & Plan Note (Signed)
Blood pressure remained soft despite getting multiple IV boluses so he was started on low-dose Levophed.  Currently blood pressure within lower normal limit on levo. ?-Continue with levo-wean as tolerated ?-Monitor blood pressure ?

## 2022-01-04 NOTE — Progress Notes (Signed)
?Progress Note ? ? ?PatientMarland Kitchen Mike Parks KCL:[id number] DOB: [birth year] DOA: 12/31/2021     4 ?DOS: the patient was seen and examined on 01/22/2022 ?  ?Brief hospital course: ?Taken from prior notes. ? ?76 yo M with past medical history of multiple sclerosis, Barrett esophagus, dysphagia s/p PEG tube placement 11/2020, T2DM, COPD, hypothyroid, GERD, overactive bladder, as reviewed from EMR, presented at [facility] ED from home with complaints of abdominal pain in the setting of a suspected clogged PEG tube and coffee ground emesis since Thurs night 12/30/21. Patient had come to the ED on 12/29/21 because his PEG tube had become dislodged. Per chart review it appears as though the PEG tube was replaced bedside, no confirmatory imaging noted. Per Dr. Adelene Idler documentation & via telephone discussion with the patient's son, abdominal pain that began the evening of 12/29/21. The patient's son reported this abdominal pain worsened on 12/30/21 and then the patient began having coffee-ground emesis on the morning of 12/31/21. Per son, the patient has been able to take all his medications as prescribed. ?  ?ED course: ?Upon arrival the patient was hypotensive & tachycardic, spitting up foamy phlegm with coffee-ground material. CT imaging shows malposition of the PEG tube in the peritoneal cavity, not inside the intestine/stomach.  Pneumoperitoneum was also noted.  Surgery was contacted for admission and emergent intervention. ? ?Patient was taken to the OR for exploratory laparotomy on 12/31/2021.  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  Patient was taken to ICU postoperatively due to peritonitis and possible for perforation. ? ?He is currently weaned off from pressors. ? ?Worsening respiratory status, currently on heated high flow at 45 L of oxygen. ? ?He was also placed on TPN until bowel functions resume. ?He is  currently on Zosyn and Diflucan. ?Palliative care was also consulted to discuss goals of care. ?Very high risk for deterioration and death. ? ?2023-01-23; patient was started on low-dose Levophed due to persistent hypotension not responding to IV fluid.  Currently blood pressure stable.  Clinically seems the same.  General surgery is going to do GI series tomorrow via NG tube.  Palliative care was also consulted and a family meeting is planned for tomorrow around noon.  We will continue current level of care at this time ? ? ?Assessment and Plan: ?* Pneumoperitoneum ?Secondary to dislodged intraperitoneal gastrostomy tube s/p exploratory laparotomy, repair, insertion of new gastrostomy tube and takedown of gastrocolonic fistula. ?Patient remained on TPN until resume bowel function. ?-Continue with TPN ?-Continue with Zosyn and Diflucan-being managed by surgery, they wanted for 7 days. ?-Continue with PPI IV twice daily. ?-Continue with supportive care ?-GI series scheduled for tomorrow ? ? ?Hypotension ?Blood pressure remained soft despite getting multiple IV boluses so he was started on low-dose Levophed.  Currently blood pressure within lower normal limit on levo. ?-Continue with levo-wean as tolerated ?-Monitor blood pressure ? ?Hypothyroidism ?Home Synthroid was initially held as patient is currently n.p.o. ?-Start him on IV Synthroid ? ?Malnutrition of moderate degree ?Estimated body mass index is 17.72 kg/m? as calculated from the following: ?  Height as of 12/29/21: 5\' 9"  (1.753 m). ?  Weight as of 12/29/21: 54.4 kg.  ? ?-Currently on TPN ? ? ?Subjective: Patient was seen and examined today.  Still stating that he is not feeling good but unable to explain what is wrong.  Stating there is not much pain today.  Feeling very  weak.  Cough reflex seems improving and overall less upper respiratory secretions.  Remained on heated high flow. ? ?Physical Exam: ?Vitals:  ? 01/04/22 1415 01/04/22 1430 01/04/22 1445 01/04/22  1500  ?BP: (!) 98/59 99/61 104/63 109/65  ?Pulse:    (!) 57  ?Resp: (!) 24 (!) 24 (!) 25 (!) 27  ?Temp:      ?TempSrc:      ?SpO2:    100%  ? ?General.  Chronically ill-appearing and lethargic elderly man, in no acute distress.  NG tube in place ?Pulmonary.  Lungs clear bilaterally, normal respiratory effort. ?CV.  Regular rate and rhythm, no JVD, rub or murmur. ?Abdomen.  Soft, nontender, nondistended, BS positive.  Drain with serosanguineous discharge, G-tube in place. ?CNS.  Alert and oriented .  No focal neurologic deficit. ?Extremities.  No edema, no cyanosis, pulses intact and symmetrical. ?Psychiatry.  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 ? ?Data Reviewed: ?I reviewed prior notes and labs. ? ?Family Communication:  ? ?Disposition: ?Status is: Inpatient ?Remains inpatient appropriate because: Severity of illness ? ? Planned Discharge Destination: To be determined ? ?DVT prophylaxis.  Lovenox ? ?Time spent: 50 minutes ? ?This record has been created using Conservation officer, historic buildings. Errors have been sought and corrected,but may not always be located. Such creation errors do not reflect on the standard of care. ? ?Author: ?Arnetha Courser, MD ?01/04/2022 3:13 PM ? ?For on call review [email or web address].  ?

## 2022-01-04 NOTE — Progress Notes (Signed)
POD # 4 s/p exploratory laparotomy with takedown of gastrocolic fistula and placement of new gastrostomy feeding tube. ?No new issues ?On low dose levophed ?Drain serosanguinous ?Still some confusion ?Follows simple commands ?No bms per RN ? ?Physical Exam: ?Constitutional: No acute distress, but somnolent ?Abdomen:  soft, non-distended, does not appear to be tender to palpation.  Bilateral drains with serosanguinous fluid.  N  G tube clamped. Midline wound healing well, dressing removed, penrose intact.  ?No peritonitis ? ?A/p doing as expected given injury ?Keep npo, ngt, tpn ?Upper gi series tomorrow via NGT. Order placed ?Continue A/Bs and supportive care  ?D/w Nursing staff ?

## 2022-01-04 NOTE — Progress Notes (Signed)
PHARMACY NOTE:  ANTIMICROBIAL RENAL DOSAGE ADJUSTMENT ? ?Current antimicrobial regimen includes a mismatch between antimicrobial dosage and estimated renal function.  As per policy approved by the Pharmacy & Therapeutics and Medical Executive Committees, the antimicrobial dosage will be adjusted accordingly. ? ?Current antimicrobial dosage:  Fluconazole 200 mg IV daily ? ?Indication: IAI ? ?Renal Function: ? ?Estimated Creatinine Clearance: 60.4 mL/min (by C-G formula based on SCr of 0.79 mg/dL). ?   ?Antimicrobial dosage has been changed to:  Fluconazole 400 mg IV daily ? ?Thank you for allowing pharmacy to be a part of this patient's care. ? ?Benita Gutter, [facility] ?01/04/2022 11:39 AM ? ? ?   ?

## 2022-01-04 NOTE — Plan of Care (Signed)
More alert today; oriented to self and situation. Difficult to understand, although speech clearer than yesterday. Moderate soft BM's today x 2. Coughing up large amount thick secretions today, with increased ability to cough since yesterday. Weaned O2 to 4 LPM with sats mid 90's. Off Levo; BP's low normal. Worked with PT today, sat at side of bed.  ? ?Problem: Education: ?Goal: Knowledge of General Education information will improve ?Description: Including pain rating scale, medication(s)/side effects and non-pharmacologic comfort measures ?Outcome: Progressing ?  ?Problem: Health Behavior/Discharge Planning: ?Goal: Ability to manage health-related needs will improve ?Outcome: Progressing ?  ?Problem: Clinical Measurements: ?Goal: Ability to maintain clinical measurements within normal limits will improve ?Outcome: Progressing ?Goal: Will remain free from infection ?Outcome: Progressing ?Goal: Diagnostic test results will improve ?Outcome: Progressing ?Goal: Respiratory complications will improve ?Outcome: Progressing ?Goal: Cardiovascular complication will be avoided ?Outcome: Progressing ?  ?Problem: Activity: ?Goal: Risk for activity intolerance will decrease ?Outcome: Progressing ?  ?Problem: Nutrition: ?Goal: Adequate nutrition will be maintained ?Outcome: Progressing ?  ?Problem: Coping: ?Goal: Level of anxiety will decrease ?Outcome: Progressing ?  ?Problem: Elimination: ?Goal: Will not experience complications related to bowel motility ?Outcome: Progressing ?Goal: Will not experience complications related to urinary retention ?Outcome: Progressing ?  ?Problem: Pain Managment: ?Goal: General experience of comfort will improve ?Outcome: Progressing ?  ?Problem: Safety: ?Goal: Ability to remain free from injury will improve ?Outcome: Progressing ?  ?Problem: Skin Integrity: ?Goal: Risk for impaired skin integrity will decrease ?Outcome: Progressing ?  ?

## 2022-01-05 ENCOUNTER — Inpatient Hospital Stay: Payer: Medicare PPO

## 2022-01-05 DIAGNOSIS — J189 Pneumonia, unspecified organism: Secondary | ICD-10-CM

## 2022-01-05 DIAGNOSIS — Z515 Encounter for palliative care: Secondary | ICD-10-CM

## 2022-01-05 DIAGNOSIS — Z7189 Other specified counseling: Secondary | ICD-10-CM

## 2022-01-05 DIAGNOSIS — J9601 Acute respiratory failure with hypoxia: Secondary | ICD-10-CM | POA: Diagnosis not present

## 2022-01-05 DIAGNOSIS — K668 Other specified disorders of peritoneum: Secondary | ICD-10-CM | POA: Diagnosis not present

## 2022-01-05 LAB — BASIC METABOLIC PANEL
Anion gap: 2 — ABNORMAL LOW (ref 5–15)
BUN: 27 mg/dL — ABNORMAL HIGH (ref 8–23)
CO2: 28 mmol/L (ref 22–32)
Calcium: 7.7 mg/dL — ABNORMAL LOW (ref 8.9–10.3)
Chloride: 107 mmol/L (ref 98–111)
Creatinine, Ser: 0.76 mg/dL (ref 0.61–1.24)
GFR, Estimated: >60 mL/min (ref >60)
Glucose, Bld: 153 mg/dL — ABNORMAL HIGH (ref 70–99)
Potassium: 3.5 mmol/L (ref 3.5–5.1)
Sodium: 137 mmol/L (ref 135–145)

## 2022-01-05 LAB — GLUCOSE, CAPILLARY
Glucose-Capillary: 111 mg/dL — ABNORMAL HIGH (ref 70–99)
Glucose-Capillary: 138 mg/dL — ABNORMAL HIGH (ref 70–99)
Glucose-Capillary: 144 mg/dL — ABNORMAL HIGH (ref 70–99)
Glucose-Capillary: 150 mg/dL — ABNORMAL HIGH (ref 70–99)
Glucose-Capillary: 151 mg/dL — ABNORMAL HIGH (ref 70–99)

## 2022-01-05 LAB — CULTURE, BLOOD (ROUTINE X 2)
Culture: NO GROWTH
Culture: NO GROWTH

## 2022-01-05 LAB — MAGNESIUM: Magnesium: 1.7 mg/dL (ref 1.7–2.4)

## 2022-01-05 LAB — PHOSPHORUS: Phosphorus: 2.7 mg/dL (ref 2.5–4.6)

## 2022-01-05 MED ORDER — MAGNESIUM SULFATE 2 GM/50ML IV SOLN
2.0000 g | Freq: Once | INTRAVENOUS | Status: AC
  Administered 2022-01-05: 2 g via INTRAVENOUS
  Filled 2022-01-05: qty 50

## 2022-01-05 MED ORDER — SODIUM CHLORIDE (PF) 0.9 % IJ SOLN
10.0000 mL | INTRAMUSCULAR | Status: DC | PRN
  Administered 2022-01-05: 10 mL

## 2022-01-05 MED ORDER — FREE WATER
30.0000 mL | Status: DC
  Administered 2022-01-05 – 2022-01-07 (×12): 30 mL

## 2022-01-05 MED ORDER — IOHEXOL 300 MG/ML  SOLN
150.0000 mL | Freq: Once | INTRAMUSCULAR | Status: AC | PRN
  Administered 2022-01-05: 50 mL

## 2022-01-05 MED ORDER — LACTATED RINGERS IV BOLUS: 1000.0000 mL | Freq: Once | INTRAVENOUS | Status: DC

## 2022-01-05 MED ORDER — TRAVASOL 10 % IV SOLN
INTRAVENOUS | Status: DC
  Filled 2022-01-05: qty 940.8

## 2022-01-05 MED ORDER — OSMOLITE 1.5 CAL PO LIQD
1000.0000 mL | ORAL | Status: DC
Start: 2022-01-05 — End: 2022-01-06
  Administered 2022-01-05: 1000 mL

## 2022-01-05 NOTE — Assessment & Plan Note (Signed)
Improving, able to wean him off from heated high flow to 2 L of oxygen. ?No baseline oxygen use. ?-Continue with supplemental oxygen- wean further as tolerated. ?-Continue with supportive care ?

## 2022-01-05 NOTE — Progress Notes (Signed)
Pt refused CPT 

## 2022-01-05 NOTE — Assessment & Plan Note (Signed)
Blood pressure within goal now and we were able to wean him off from pressor. ?-Monitor blood pressure ?

## 2022-01-05 NOTE — Progress Notes (Signed)
PHARMACY - TOTAL PARENTERAL NUTRITION CONSULT NOTE  ? ?Indication: Intolerance to enteral feeding ? ?Assessment: 76 yo M w/ PMH of  COPD, multiple sclerosis, diabetes, Barrett esophagus presenting to [facility] ED from home with complaints of abdominal pain in the setting of a suspected clogged PEG tube and coffee ground emesis since Thurs night 12/30/21.  ? ?Glucose / Insulin: q6h sSSI ?BG previous 24h: 138 - 148 (SSI required 3 units) ?Electrolytes: Potassium is LLN; magnesium and phosphorus WNL ?Renal: AKI resolved. Scr 0.76  ?Hepatic: Within normal limits ?Intake / Output; MIVF: LR at 30 cc/hr, NS PRN for IV medication delivery ?GI Imaging: ?GI Surgeries / Procedures:  ?03/18: Exploratory laparotomy ?03/22: GI series  ?No gastric leaks or fistulas found  ? ?Central access: PICC placed 01/01/22 ?TPN start date: 01/01/22 ? ?Nutritional Goals: ?Goal TPN rate is 70 mL/hr (provides 100 g of protein and 1900 kcals per day) ? ?RD Assessment:  ?Estimated Needs ?Total Energy Estimated Needs: 1800-2000 ?Total Protein Estimated Needs: 90-105g ?Total Fluid Estimated Needs: 1.4-1.6L/day ? ?Current Nutrition:  ?NPO ? ?Plan:  ?Continue TPN at 70 mL/hr at 1800 (total volume including overfill 1780 mL) ?Nutritional components ?Amino acids (using 10% Travasol): 94 grams ?Dextrose: 260 grams ?Lipids (using 20% SMOFlipids): 53.8 grams ?kCal (24h): 1800  ?Electrolytes in TPN: Na 20mEq/L, K 74mEq/L, Ca 5mEq/L, Mg 46mEq/L, and Phos 28mmol/L. Cl:Ac 1:1 ?Add standard MVI and trace elements to TPN, IV thiamine x 3 days ?Continue Sensitive SSI at q6h and adjust as needed  ?Stopped lactated ringer's infusion today; fluid goals per surgery / primary team ?Monitor TPN labs on Mon/Thurs, daily until stable ? ?Lendon Ka, PharmD Candidate ?01/05/2022,8:12 AM ? ?

## 2022-01-05 NOTE — Consult Note (Signed)
? ?                                                                                ?Consultation Note ?Date: 01/05/2022  ? ?Patient Name: Mike Parks  ?DOB: 08/31/1946  MRN: [id number]  Age / Sex: 76 y.o., male  ?PCP: [place] ?Referring Physician: Lorella Nimrod, MD ? ?Reason for Consultation: Goals of care ? ?HPI/Patient Profile: 76 y.o. male  with past medical history of MS with [facility], Barre'ts esophagus, DM admitted on 12/31/2021 with sepsis GI tube dislodged and peritonitis. Recovery complicated by pneumonia with pleural effusions. He has been weaned off pressors and is currently on room air. GI series has confirmed placement of PEG and he has been started on trickle tube feedings.  ? ?Primary Decision Maker ?NEXT OF KIN - patient's son- [name] ? ?Discussion: ?I have reviewed medical records including EPIC notes, labs and imaging, received report from patient's nurse, assessed the patient and then met with his son, [name], and daughter in law Belinda  to discuss diagnosis prognosis, [place], EOL wishes, disposition and options. ? ?I introduced Palliative Medicine as specialized medical care for people living with serious illness. It focuses on providing relief from the symptoms and stress of a serious illness. The goal is to improve quality of life for both the patient and the family. ? ?[place] is sleeping, does not wake to my voice or touch.  ? ?We discussed a brief life review of the patient. 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 Married- but communication with spouse is difficult due to hearing and language barrier. Therefore [name] is primary caretaker and decision maker for patient.  ? ?As far as functional and nutritional status - family notes decline over the last year. Prior to this incident he was dependent on tube feedings. Able to ambulate minimally in the home, pretty much lived bed to chair. Required assistance with ADL's provided by his son.  ? ? We  discussed patient's current illness and what it means in the larger context of patient's on-going co-morbidities.  Natural disease trajectory and expectations at EOL were discussed. [name] notes that Loel has a soldier like mentality. At the beginning of the admission [name] asked him multiple times if he was certain he wanted to proceed with aggressive medical care over just receiving comfort care and [place] was clear that he did.  ? ?I attempted to elicit values and goals of care important to the patient.  Goals of care are to be able to be awake and able to interact with family. [name] is aware that patient will likely require 24 hr care from his significant deconditioning and notes that this is acceptable to patient. He is amenable to nursing facility placement.  ? ?Advance directives, concepts specific to code status, artificial feeding and hydration, and rehospitalization were considered and discussed.  ? ?Encouraged patient/family to consider DNR/DNI status understanding evidenced based poor outcomes in similar hospitalized patients, as the cause of the arrest is likely associated with chronic/terminal disease rather than a reversible acute cardio-pulmonary event. [name] agrees to DNR based on CPR would not likely meet patient's goals  of restoring him to the point where he would be able to interact with his family and be aware of their presence.  ? ?Discussed with patient/family the importance of continued conversation with family and the medical providers regarding overall plan of care and treatment options, ensuring decisions are within the context of the patient?s values and GOCs.   ? ?Questions and concerns were addressed. The family was encouraged to call with questions or concerns.  ? ?  ? ?SUMMARY OF RECOMMENDATIONS ?-Sepsis r/t pneumoperitonitis, pneumonia- clinically improving- but he remains very deconditioned and likely has worsened baseline functional status ?-DNR ?-Family wishes to continue all  aggressive medical interventions as long as patient is able to wake and be aware of their presence- if he worsens or his mental status does not improve they are open to discussions regarding comfort care   ? ?Code Status/Advance Care Planning: ?DNR ? ? ?Prognosis:   ?Unable to determine ? ?Discharge Planning: To Be Determined ? ?Primary Diagnoses: ?Present on Admission: ? Pneumoperitoneum ? Malnutrition of moderate degree ? Hypothyroidism ? ? ?Review of Systems  ?Unable to perform ROS: Acuity of condition  ? ?Physical Exam ?Vitals and nursing note reviewed.  ?Constitutional:   ?   Appearance: He is ill-appearing.  ?Pulmonary:  ?   Effort: Pulmonary effort is normal.  ?Neurological:  ?   Comments: sleeping  ? ? ?Vital Signs: BP (!) 97/54 (BP Location: Left Arm)   Pulse (!) 53   Temp 98.1 ?F (36.7 ?C) (Axillary)   Resp (!) 27   Wt 62.2 kg   SpO2 97%   BMI 20.25 kg/m?  ?Pain Scale: PAINAD ?  ?Pain Score: Asleep ? ? ?SpO2: SpO2: 97 % ?O2 Device:SpO2: 97 % ?O2 Flow Rate: .O2 Flow Rate (L/min): 2 L/min ? ?IO: Intake/output summary:  ?Intake/Output Summary (Last 24 hours) at 01/05/2022 1354 ?Last data filed at 01/05/2022 1300 ?Gross per 24 hour  ?Intake 3008.14 ml  ?Output 2085 ml  ?Net 923.14 ml  ? ? ?LBM: Last BM Date : 01/05/22 ?Baseline Weight: Weight: 62.2 kg ?Most recent weight: Weight: 62.2 kg     ?Palliative Assessment/Data: PPS: 10% ? ? ?Thank you for this consult. Palliative medicine will continue to follow and assist as needed.  ? ? ?Signed by: ?Mariana Kaufman, AGNP-C ?Palliative Medicine ? ?  ?Please contact Palliative Medicine Team phone at [phone] for questions and concerns.  ?For individual provider: See Amion ? ? ? ? ? ? ? ? ? ? ? ? ? ? ?

## 2022-01-05 NOTE — Progress Notes (Signed)
Nutrition Follow Up Note  ? ?DOCUMENTATION CODES:  ? ?Non-severe (moderate) malnutrition in context of chronic illness ? ?INTERVENTION:  ? ?Continue TPN per pharmacy- currently at goal rate  ? ?Osmolite 1.5$RemoveBefore'@55ml'PQmHkqAFKDNJB$ /hr- Initiate at 60ml/hr and increase by 38ml/hr q 8 hours until goal rate is reached.  ? ?Pro-Source 80ml BID via tube, provides 40kcal and 11g of protein per serving  ? ?Free water flushes 36ml q4 hours to maintain tube patency  ? ?Regimen provides 2060kcal/day, 105g/day protein and 1127ml/day of free water.  ? ?NUTRITION DIAGNOSIS:  ? ?Moderate Malnutrition related to chronic illness (MS, COPD, dysphagia) as evidenced by mild fat depletion, moderate fat depletion, moderate muscle depletion, severe muscle depletion. ? ?GOAL:  ? ?Patient will meet greater than or equal to 90% of their needs ?-met with TPN  ? ?MONITOR:  ? ?Labs, Weight trends, Skin, I & O's, Other (Comment) (TPN) ? ?ASSESSMENT:  ? ?76 y/o male with h/o MS, Barrett's esophagus, dysphagia with chronic G-tube (since 11/2020), COPD, DM, GERD and hypothyroidism who is admitted with PNA, septic shock and peritonitis secondary to dislodged G-tube s/p exploratory laparotomy 3/18 (with takedown of gastrocolonic fistula with primary stapled repair of gastrostomy site and colotomy site, placement of new open feeding gastrostomy tube) ? ?Pt continues to tolerate TPN at goal rate. Refeed labs stable. UGI today notes no extraluminal contrast seen to suggest a gastric leak or fistula; pt is noted to have GERD. Will plan to start trickle feeds today and advance as tolerated. Pt only has one documented weight in chart; pt is ordered for daily weights. Pt + 6.1L on his I & Os. NGT with 166ml output. Drains with 136ml output. Pt is having bowel movements.  ? ?Medications reviewed and include: lovenox, insulin, synthroid, protonix, diflucan, zosyn  ? ?Labs reviewed: Na 137 wnl, K 3.5 wnl, BUN 27(H), P 2.7 wnl, Mg 1.7 wnl ?Triglycerides- 47- 3/20 ?Wbc- 10.6(H),  Hgb 9.3(L), Hct 28.3(L)- 3/20 ?Cbgs- 151, 138, 111 x 24 hrs ? ?Diet Order:   ?Diet Order   ? ?       ?  Diet NPO time specified  Diet effective now       ?  ? ?  ?  ? ?  ? ?EDUCATION NEEDS:  ? ?Not appropriate for education at this time ? ?Skin:  Skin Assessment: Skin Integrity Issues: ?Skin Integrity Issues:: Incisions ?Incisions: 3/18 abdomen ? ?Last BM:  3/22- type 6 ? ?Height:  ? ?Ht Readings from Last 1 Encounters:  ?12/29/21 $RemoveBe'5\' 9"'OLzjMGsSm$  (1.753 m)  ? ? ?Weight:  ? ?Wt Readings from Last 1 Encounters:  ?01/05/22 62.2 kg  ? ?BMI:  Body mass index is 20.25 kg/m?. ? ?Estimated Nutritional Needs:  ? ?Kcal:  1800-2000kcal/day  ? ?Protein:  90-105g/day  ? ?Fluid:  1.4-1.6L/day ? ?Koleen Distance MS, RD, LDN ?Please refer to AMION for RD and/or RD on-call/weekend/after hours pager ? ?

## 2022-01-05 NOTE — Progress Notes (Signed)
[facility] ?SURGICAL PROGRESS NOTE ? ?Hospital Day(s): 5.  ? ?Post op day(s): 5 Days Post-Op.  ? ?Interval History:  ?Patient seen and examined ?No acute events or new complaints overnight.  ?Patient reports he is having abdominal pain, points to his abdomen, unable to illicit more ?Renal function is normal; sCr - 0.76; UO - 1.4L ?No electrolyte derangements  ?LLQ drain with 100 ccs out; serous ?RLQ drain with 10 ccs out; serous  ?He does have NGT in place ?He has had BMs and actively having one oat time of examination ?Plan for UGI today  ? ?Vital signs in last 24 hours: [min-max] current  ?Temp:  [97.2 ?F (36.2 ?C)-99 ?F (37.2 ?C)] 97.4 ?F (36.3 ?C) (03/22 0400) ?Pulse Rate:  [51-63] 56 (03/22 0600) ?Resp:  [18-32] 24 (03/22 0600) ?BP: (97-122)/(49-72) 105/56 (03/22 0600) ?SpO2:  [94 %-100 %] 100 % (03/22 0600) ?Weight:  [62.2 kg] 62.2 kg (03/22 0500)       Weight: 62.2 kg BMI (Calculated): 20.24  ? ?Intake/Output last 2 shifts:  ?03/21 0701 - 03/22 0700 ?In: 3029.8 [I.V.:2351.6; IV Piggyback:678.2] ?Out: 1665 [Urine:1400; Emesis/NG output:155; Drains:110]  ? ?Physical Exam:  ?Constitutional: alert, cooperative and no distress  ?HEENT: NGT in place  ?Respiratory: breathing non-labored at rest  ?Cardiovascular: regular rate and sinus rhythm  ?Gastrointestinal: Soft, abdominal soreness, non-distended, surgical drains in RLQ/LLQ both with serous output, gastrostomy tube in place; site CDI ? ?Labs:  ?CBC Latest Ref Rng & Units 01/03/2022 01/02/2022 01/01/2022  ?WBC 4.0 - 10.5 K/uL 10.6(H) 12.1(H) 16.1(H)  ?Hemoglobin 13.0 - 17.0 g/dL 0.3(T) 5.9(R) 12.1(L)  ?Hematocrit 39.0 - 52.0 % 28.3(L) 28.1(L) 36.0(L)  ?Platelets 150 - 400 K/uL 156 166 213  ? ?CMP Latest Ref Rng & Units 01/05/2022 01/04/2022 01/03/2022  ?Glucose 70 - 99 mg/dL 416(L) 845(X) 646(O)  ?BUN 8 - 23 mg/dL 03(O) 12(Y) 48(G)  ?Creatinine 0.61 - 1.24 mg/dL 5.00 3.70 4.88  ?Sodium 135 - 145 mmol/L 137 138 139  ?Potassium 3.5 - 5.1 mmol/L 3.5  3.2(L) 3.2(L)  ?Chloride 98 - 111 mmol/L 107 105 106  ?CO2 22 - 32 mmol/L 28 26 27   ?Calcium 8.9 - 10.3 mg/dL 7.7(L) 7.7(L) 8.0(L)  ?Total Protein 6.5 - 8.1 g/dL - - 4.7(L)  ?Total Bilirubin 0.3 - 1.2 mg/dL - - 0.6  ?Alkaline Phos 38 - 126 U/L - - 51  ?AST 15 - 41 U/L - - 25  ?ALT 0 - 44 U/L - - 16  ? ? ? ?Imaging studies: No new pertinent imaging studies; UGI pending  ? ? ?Assessment/Plan: ?76 y.o. male 5 Days Post-Op s/p exploratory laparotomy, takedown of gastrocolonic fistula with primary stapled repair of gastrostomy site and colotomy site, placement of new open feeding gastrostomy tube ? ? - Will get UGI via NGT this morning. If no leak, will plan to remove NGT and start enteric trickle feeds via G-tube ? - Monitor abdominal examination ?- Continue surgical drains; serous ? - Pain control prn  ? - Further management per primary service; we will follow   ? ?All of the above findings and recommendations were discussed with the patient, p, and the medical team. ? ?-- ?73, PA-C ? [facility] ?01/05/2022, 7:58 AM ?[phone] ?M-F: 7am - 4pm ? ?

## 2022-01-05 NOTE — Assessment & Plan Note (Signed)
Secondary to dislodged intraperitoneal gastrostomy tube s/p exploratory laparotomy, repair, insertion of new gastrostomy tube and takedown of gastrocolonic fistula. ?Patient remained on TPN until resume bowel function. ?GI series with no leak-surgery is planning to start trickle feed through G-tube. ?-Continue with TPN ?-Continue with Zosyn and Diflucan-being managed by surgery, they wanted for 7 days. ?-Continue with PPI IV twice daily. ?-Continue with supportive care ? ? ?

## 2022-01-05 NOTE — Progress Notes (Signed)
?Progress Note ? ? ?PatientMarland Parks EDMON MAGID SJG:[id number] DOB: [birth year] DOA: 12/31/2021     5 ?DOS: the patient was seen and examined on 01/05/2022 ?  ?Brief hospital course: ?Taken from prior notes. ? ?76 yo M with past medical history of multiple sclerosis, Barrett esophagus, dysphagia s/p PEG tube placement 11/2020, T2DM, COPD, hypothyroid, GERD, overactive bladder, as reviewed from EMR, presented at [facility] ED from home with complaints of abdominal pain in the setting of a suspected clogged PEG tube and coffee ground emesis since Thurs night 12/30/21. Patient had come to the ED on 12/29/21 because his PEG tube had become dislodged. Per chart review it appears as though the PEG tube was replaced bedside, no confirmatory imaging noted. Per Dr. Adelene Idler documentation & via telephone discussion with the patient's son, abdominal pain that began the evening of 12/29/21. The patient's son reported this abdominal pain worsened on 12/30/21 and then the patient began having coffee-ground emesis on the morning of 12/31/21. Per son, the patient has been able to take all his medications as prescribed. ?  ?ED course: ?Upon arrival the patient was hypotensive & tachycardic, spitting up foamy phlegm with coffee-ground material. CT imaging shows malposition of the PEG tube in the peritoneal cavity, not inside the intestine/stomach.  Pneumoperitoneum was also noted.  Surgery was contacted for admission and emergent intervention. ? ?Patient was taken to the OR for exploratory laparotomy on 12/31/2021.  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  Patient was taken to ICU postoperatively due to peritonitis and possible for perforation. ? ?He is currently weaned off from pressors. ? ?Worsening respiratory status, currently on heated high flow at 45 L of oxygen. ? ?He was also placed on TPN until bowel functions resume. ?He is  currently on Zosyn and Diflucan. ?Palliative care was also consulted to discuss goals of care. ?Very high risk for deterioration and death. ? ?01-25-2023; patient was started on low-dose Levophed due to persistent hypotension not responding to IV fluid.  Currently blood pressure stable.  Clinically seems the same.  General surgery is going to do GI series tomorrow via NG tube.  Palliative care was also consulted and a family meeting is planned for tomorrow around noon.  We will continue current level of care at this time. ? ?3/22: Patient had upper GI series which was negative for any leak, surgery is planning to start trickle feed through G-tube. ?Palliative care meeting with family, patient is not DNR with full scope of medical care.  If he deteriorates then they are open to discuss about comfort care but would like to continue current level of care at this time. ?Remains very lethargic but off the pressors now, able to wean off to 2 L of oxygen. ? ? ?Assessment and Plan: ?* Pneumoperitoneum ?Secondary to dislodged intraperitoneal gastrostomy tube s/p exploratory laparotomy, repair, insertion of new gastrostomy tube and takedown of gastrocolonic fistula. ?Patient remained on TPN until resume bowel function. ?GI series with no leak-surgery is planning to start trickle feed through G-tube. ?-Continue with TPN ?-Continue with Zosyn and Diflucan-being managed by surgery, they wanted for 7 days. ?-Continue with PPI IV twice daily. ?-Continue with supportive care ? ? ? ?Acute respiratory failure with hypoxia (HCC) ?Improving, able to wean him off from heated high flow to 2 L of oxygen. ?No baseline oxygen use. ?-Continue with supplemental oxygen- wean further as tolerated. ?-Continue with supportive care ? ?Hypotension ?Blood  pressure within goal now and we were able to wean him off from pressor. ?-Monitor blood pressure ? ?Malnutrition of moderate degree ?Estimated body mass index is 17.72 kg/m? as calculated from the  following: ?  Height as of 12/29/21: 5\' 9"  (1.753 m). ?  Weight as of 12/29/21: 54.4 kg.  ? ?-Currently on TPN ? ?Hypothyroidism ?Home Synthroid was initially held as patient is currently n.p.o. ?-Start him on IV Synthroid ? ?  ? ?Subjective: Patient appears quite lethargic and nodding yes when asked about pain.  Pointing towards barely.  He just came back from radiology after getting GI series. ? ?Physical Exam: ?Vitals:  ? 01/05/22 1200 01/05/22 1300 01/05/22 1400 01/05/22 1500  ?BP: (!) 96/54 (!) 97/54 (!) 97/57 (!) 95/54  ?Pulse: (!) 51 (!) 53 (!) 52 (!) 55  ?Resp: (!) 22 (!) 27 (!) 30 (!) 32  ?Temp: 98.1 ?F (36.7 ?C)     ?TempSrc: Axillary     ?SpO2: 97% 97% 98% 97%  ?Weight:      ? ?General.  Chronically ill-appearing elderly man, in no acute distress. ?Pulmonary.  Lungs clear bilaterally, normal respiratory effort. ?CV.  Regular rate and rhythm, no JVD, rub or murmur. ?Abdomen.  Soft, nontender, nondistended, BS positive. ?CNS.  Appears lethargic, following some simple commands, no focal neurologic deficit. ?Extremities.  No edema, no cyanosis, pulses intact and symmetrical. ?Psychiatry.  Judgment and insight appears impaired. ? ?Data Reviewed: ?Prior notes and labs reviewed. ? ?Family Communication:  ? ?Disposition: ?Status is: Inpatient ?Remains inpatient appropriate because: Severity of illness ? ? Planned Discharge Destination: To be determined ? ?DVT prophylaxis.  Lovenox ? ?Time spent: 45 minutes ? ?This record has been created using 01/07/22. Errors have been sought and corrected,but may not always be located. Such creation errors do not reflect on the standard of care. ? ?Author: ?Conservation officer, historic buildings, MD ?01/05/2022 5:07 PM ? ?For on call review [email or web address].  ?

## 2022-01-05 NOTE — Plan of Care (Signed)
Pt ANOx1. Copious secretions needing frequent suctioning. SB/NSR. VSS. Continuing trickle TF. Large loose BM x3. NP notified. Mg 1.5; orders received to supplement. Midline incision CDI without drainage noted.  ?Problem: Education: ?Goal: Knowledge of General Education information will improve ?Description: Including pain rating scale, medication(s)/side effects and non-pharmacologic comfort measures ?Outcome: Progressing ?  ?Problem: Health Behavior/Discharge Planning: ?Goal: Ability to manage health-related needs will improve ?Outcome: Progressing ?  ?Problem: Clinical Measurements: ?Goal: Ability to maintain clinical measurements within normal limits will improve ?Outcome: Progressing ?Goal: Will remain free from infection ?Outcome: Progressing ?Goal: Diagnostic test results will improve ?Outcome: Progressing ?Goal: Respiratory complications will improve ?Outcome: Progressing ?Goal: Cardiovascular complication will be avoided ?Outcome: Progressing ?  ?Problem: Activity: ?Goal: Risk for activity intolerance will decrease ?Outcome: Progressing ?  ?Problem: Nutrition: ?Goal: Adequate nutrition will be maintained ?Outcome: Progressing ?  ?Problem: Coping: ?Goal: Level of anxiety will decrease ?Outcome: Progressing ?  ?Problem: Elimination: ?Goal: Will not experience complications related to bowel motility ?Outcome: Progressing ?Goal: Will not experience complications related to urinary retention ?Outcome: Progressing ?  ?Problem: Pain Managment: ?Goal: General experience of comfort will improve ?Outcome: Progressing ?  ?Problem: Safety: ?Goal: Ability to remain free from injury will improve ?Outcome: Progressing ?  ?Problem: Skin Integrity: ?Goal: Risk for impaired skin integrity will decrease ?Outcome: Progressing ?  ?

## 2022-01-06 DIAGNOSIS — J9601 Acute respiratory failure with hypoxia: Secondary | ICD-10-CM

## 2022-01-06 DIAGNOSIS — K659 Peritonitis, unspecified: Secondary | ICD-10-CM | POA: Diagnosis not present

## 2022-01-06 DIAGNOSIS — E440 Moderate protein-calorie malnutrition: Secondary | ICD-10-CM | POA: Diagnosis not present

## 2022-01-06 DIAGNOSIS — K668 Other specified disorders of peritoneum: Secondary | ICD-10-CM | POA: Diagnosis not present

## 2022-01-06 DIAGNOSIS — L8990 Pressure ulcer of unspecified site, unspecified stage: Secondary | ICD-10-CM | POA: Diagnosis not present

## 2022-01-06 LAB — COMPREHENSIVE METABOLIC PANEL
ALT: 55 U/L — ABNORMAL HIGH (ref 0–44)
AST: 51 U/L — ABNORMAL HIGH (ref 15–41)
Albumin: 1.6 g/dL — ABNORMAL LOW (ref 3.5–5.0)
Alkaline Phosphatase: 48 U/L (ref 38–126)
Anion gap: 5 (ref 5–15)
BUN: 27 mg/dL — ABNORMAL HIGH (ref 8–23)
CO2: 27 mmol/L (ref 22–32)
Calcium: 7.7 mg/dL — ABNORMAL LOW (ref 8.9–10.3)
Chloride: 107 mmol/L (ref 98–111)
Creatinine, Ser: 0.72 mg/dL (ref 0.61–1.24)
GFR, Estimated: >60 mL/min (ref >60)
Glucose, Bld: 159 mg/dL — ABNORMAL HIGH (ref 70–99)
Potassium: 3.7 mmol/L (ref 3.5–5.1)
Sodium: 139 mmol/L (ref 135–145)
Total Bilirubin: 0.3 mg/dL (ref 0.3–1.2)
Total Protein: 5 g/dL — ABNORMAL LOW (ref 6.5–8.1)

## 2022-01-06 LAB — PHOSPHORUS: Phosphorus: 2.8 mg/dL (ref 2.5–4.6)

## 2022-01-06 LAB — CBC
HCT: 27.7 % — ABNORMAL LOW (ref 39.0–52.0)
Hemoglobin: 9 g/dL — ABNORMAL LOW (ref 13.0–17.0)
MCH: 30.2 pg (ref 26.0–34.0)
MCHC: 32.5 g/dL (ref 30.0–36.0)
MCV: 93 fL (ref 80.0–100.0)
Platelets: 178 10*3/uL (ref 150–400)
RBC: 2.98 MIL/uL — ABNORMAL LOW (ref 4.22–5.81)
RDW: 14.3 % (ref 11.5–15.5)
WBC: 9.2 10*3/uL (ref 4.0–10.5)
nRBC: 0 % (ref 0.0–0.2)

## 2022-01-06 LAB — MAGNESIUM: Magnesium: 1.5 mg/dL — ABNORMAL LOW (ref 1.7–2.4)

## 2022-01-06 LAB — GLUCOSE, CAPILLARY
Glucose-Capillary: 127 mg/dL — ABNORMAL HIGH (ref 70–99)
Glucose-Capillary: 144 mg/dL — ABNORMAL HIGH (ref 70–99)
Glucose-Capillary: 124 mg/dL — ABNORMAL HIGH (ref 70–99)

## 2022-01-06 MED ORDER — PANTOPRAZOLE 2 MG/ML SUSPENSION
40.0000 mg | Freq: Two times a day (BID) | ORAL | Status: DC
  Administered 2022-01-06 – 2022-01-15 (×14): 40 mg
  Filled 2022-01-06 (×18): qty 20

## 2022-01-06 MED ORDER — OXYCODONE HCL 5 MG/5ML PO SOLN
2.5000 mg | ORAL | Status: DC | PRN
  Administered 2022-01-06 – 2022-01-13 (×20): 5 mg
  Filled 2022-01-06 (×21): qty 5

## 2022-01-06 MED ORDER — TRAVASOL 10 % IV SOLN
INTRAVENOUS | Status: AC
  Filled 2022-01-06 (×3): qty 470.4

## 2022-01-06 MED ORDER — PROSOURCE TF PO LIQD
45.0000 mL | Freq: Two times a day (BID) | ORAL | Status: DC
  Administered 2022-01-07 – 2022-01-09 (×5): 45 mL
  Filled 2022-01-06 (×6): qty 45

## 2022-01-06 MED ORDER — ZINC OXIDE 40 % EX OINT
TOPICAL_OINTMENT | Freq: Three times a day (TID) | CUTANEOUS | Status: DC
  Filled 2022-01-06: qty 113

## 2022-01-06 MED ORDER — JUVEN PO PACK
1.0000 | PACK | Freq: Two times a day (BID) | ORAL | Status: DC
Start: 2022-01-07 — End: 2022-01-12
  Administered 2022-01-07 – 2022-01-12 (×7): 1

## 2022-01-06 MED ORDER — LEVOTHYROXINE SODIUM 50 MCG PO TABS
75.0000 ug | ORAL_TABLET | Freq: Every day | ORAL | Status: DC
  Administered 2022-01-07 – 2022-01-15 (×8): 75 ug
  Filled 2022-01-06: qty 1
  Filled 2022-01-06: qty 2
  Filled 2022-01-06 (×7): qty 1

## 2022-01-06 MED ORDER — MAGNESIUM SULFATE 2 GM/50ML IV SOLN
2.0000 g | Freq: Once | INTRAVENOUS | Status: AC
  Administered 2022-01-06: 2 g via INTRAVENOUS
  Filled 2022-01-06: qty 50

## 2022-01-06 MED ORDER — OSMOLITE 1.5 CAL PO LIQD
1000.0000 mL | ORAL | Status: DC
  Administered 2022-01-06 – 2022-01-07 (×2): 1000 mL

## 2022-01-06 MED ORDER — ACETAMINOPHEN 160 MG/5ML PO SOLN
650.0000 mg | Freq: Four times a day (QID) | ORAL | Status: DC | PRN
  Administered 2022-01-08 – 2022-01-09 (×2): 650 mg
  Filled 2022-01-06 (×4): qty 20.3

## 2022-01-06 NOTE — [place] FL2 (Signed)
?[place] MEDICAID FL2 LEVEL OF CARE SCREENING TOOL  ?  ? ?IDENTIFICATION  ?Patient Name: ?Mike Parks Birthdate: 01-02-1946 Sex: male Admission Date (Current Location): ?12/31/2021  ?[place] and [place] Number: ? [place] ?  Facility and Address:  ?[facility], [address], [place], [place] [address] ?     Provider Number: ?[id number]  ?Attending Physician Name and Address:  ?Lorella Nimrod, MD ? Relative Name and Phone Number:  ?Yazeed Culver- son- [phone] ?   ?Current Level of Care: ?Hospital Recommended Level of Care: ?[place] Prior Approval Number: ?  ? ?Date Approved/Denied: ?  PASRR Number: ?[id number] ? ?Discharge Plan: ?SNF ?  ? ?Current Diagnoses: ?Patient Active Problem List  ? Diagnosis Date Noted  ? Pressure injury of skin 01/06/2022  ? Acute respiratory failure with hypoxia ([place]) 01/05/2022  ? Malnutrition of moderate degree 01/03/2022  ? Hypothyroidism 01/03/2022  ? Hypotension 01/03/2022  ? Pneumoperitoneum 12/31/2021  ? ? ?Orientation RESPIRATION BLADDER Height & Weight   ?  ?Self ? Normal Incontinent Weight: 62.2 kg ?Height:     ?BEHAVIORAL SYMPTOMS/MOOD NEUROLOGICAL BOWEL NUTRITION STATUS  ?    Incontinent Feeding tube  ?AMBULATORY STATUS COMMUNICATION OF NEEDS Skin   ?Extensive Assist Verbally PU Stage and Appropriate Care, Skin abrasions ?PU Stage 1 Dressing: Daily ?  ?  ?    ?     ?     ? ? ?Personal Care Assistance Level of Assistance  ?Bathing, Feeding, Dressing Bathing Assistance: Maximum assistance ?Feeding assistance: Maximum assistance ?Dressing Assistance: Maximum assistance ?   ? ?Functional Limitations Info  ?Sight, Hearing, Speech Sight Info: Impaired ?Hearing Info: Adequate ?Speech Info: Adequate  ? ? ?SPECIAL CARE FACTORS FREQUENCY  ?PT (By licensed PT), OT (By licensed OT)   ?  ?PT Frequency: per therapy recommendations min 5 times per week ?OT Frequency: per therapy recommendations ?  ?  ?  ?   ? ? ?Contractures  Contractures Info: Not present  ? ? ?Additional Factors Info  ?Code Status, Allergies Code Status Info: DNR ?Allergies Info: NKA ?  ?  ?  ?   ? ?Current Medications (01/06/2022):  This is the current hospital active medication list ?Current Facility-Administered Medications  ?Medication Dose Route Frequency Provider Last Rate Last Admin  ? 0.9 %  sodium chloride infusion   Intravenous PRN Lorella Nimrod, MD   Stopped at 01/06/22 0555  ? chlorhexidine (PERIDEX) 0.12 % solution 15 mL  15 mL Mouth Rinse BID Tyler Pita, MD   15 mL at 01/06/22 [id number]  ? Chlorhexidine Gluconate Cloth 2 % PADS 6 each  6 each Topical Daily Olean Ree, MD   6 each at 01/05/22 2146  ? enoxaparin (LOVENOX) injection 40 mg  40 mg Subcutaneous Q24H Piscoya, Jose, MD   40 mg at 01/06/22 [id number]  ? feeding supplement (OSMOLITE 1.5 CAL) liquid 1,000 mL  1,000 mL Per Tube Q24H Piscoya, Jose, MD   1,000 mL at 01/05/22 1303  ? fluconazole (DIFLUCAN) IVPB 400 mg  400 mg Intravenous Q24H Benita Gutter, [facility]   Paused at 01/05/22 1331  ? free water 30 mL  30 mL Per Tube Q4H Piscoya, Jose, MD   30 mL at 01/06/22 0907  ? HYDROmorphone (DILAUDID) injection 0.5 mg  0.5 mg Intravenous Q4H PRN Piscoya, Jose, MD   0.5 mg at 01/04/22 1956  ? insulin aspart (novoLOG) injection 0-9 Units  0-9 Units Subcutaneous Q6H Dallie Piles, RPH   1 Units  at 01/06/22 0553  ? [START ON 01/07/2022] levothyroxine (SYNTHROID) tablet 75 mcg  75 mcg Per Tube Daily Benita Gutter, RPH      ? liver oil-zinc oxide (DESITIN) 40 % ointment   Topical TID Lorella Nimrod, MD      ? MEDLINE mouth rinse  15 mL Mouth Rinse q12n4p Tyler Pita, MD   15 mL at 01/05/22 1602  ? ondansetron (ZOFRAN-ODT) disintegrating tablet 4 mg  4 mg Oral Q6H PRN Piscoya, Jose, MD      ? Or  ? ondansetron (ZOFRAN) injection 4 mg  4 mg Intravenous Q6H PRN Piscoya, Jose, MD      ? pantoprazole sodium (PROTONIX) 40 mg/20 mL oral suspension 40 mg  40 mg Per Tube BID Benita Gutter, RPH      ?  piperacillin-tazobactam (ZOSYN) IVPB 3.375 g  3.375 g Intravenous Q8H Amin, Soundra Pilon, MD 12.5 mL/hr at 01/06/22 0900 Infusion Verify at 01/06/22 0900  ? sodium chloride (PF) 0.9 % injection 10 mL  10 mL Per Tube PRN Caroleen Hamman F, MD   10 mL at 01/05/22 0950  ? sodium chloride flush (NS) 0.9 % injection 10-40 mL  10-40 mL Intracatheter Q12H Piscoya, Jose, MD   10 mL at 01/06/22 0914  ? sodium chloride flush (NS) 0.9 % injection 10-40 mL  10-40 mL Intracatheter PRN Olean Ree, MD   10 mL at 01/01/22 2143  ? TPN ADULT (ION)   Intravenous Continuous TPN Benita Gutter, RPH 70 mL/hr at 01/06/22 0900 Infusion Verify at 01/06/22 0900  ? ? ? ?Discharge Medications: ?Please see discharge summary for a list of discharge medications. ? ?Relevant Imaging Results: ? ?Relevant Lab Results: ? ? ?Additional Information ?SS# [id number] ? ?Shelbie Hutching, RN ? ? ? ? ?

## 2022-01-06 NOTE — TOC Initial Note (Signed)
Transition of Care (TOC) - Initial/Assessment Note  ? ? ?Patient Details  ?Name: Mike Parks ?MRN: [id number] ?Date of Birth: 04/15/1946 ? ?Transition of Care (TOC) CM/SW Contact:    ?Allayne Butcher, RN ?Phone Number: ?01/06/2022, 9:59 AM ? ?Clinical Narrative:                 ?Patient admitted to the hospital for dislodged G tube and pneumoperitoneum, S/P ex lap.  Patient is in the ICU stepdown status.  RNCM spoke with patient's son via phone, introduced self and explained role in DC planning. ?Patient is from home with his wife and son, mostly independent but wife and son help him a lot.  He and his wife do not drive, son provides all transportation.  Patient is current at the [facility] in [place].  Patient does not have any DME at home. ?PT has current reommendation for SNF, patient has never been to rehab in the past or had home health services.  Son agrees with whatever the recommendation is to help his father. ?RNCM will start bed search.   ? ?Expected Discharge Plan: Skilled Nursing Facility ?Barriers to Discharge: Continued Medical Work up ? ? ?Patient Goals and CMS Choice ?Patient states their goals for this hospitalization and ongoing recovery are:: son agrees with short term rehab ?CMS Medicare.gov Compare Post Acute Care list provided to:: Patient Represenative (must comment) ?Choice offered to / list presented to : Adult Children, Spouse ? ?Expected Discharge Plan and Services ?Expected Discharge Plan: Skilled Nursing Facility ?  ?Discharge Planning Services: CM Consult ?Post Acute Care Choice: Skilled Nursing Facility ?Living arrangements for the past 2 months: Apartment ?                ?DME Arranged: N/A ?DME Agency: NA ?  ?  ?  ?HH Arranged: NA ?HH Agency: NA ?  ?  ?  ? ?Prior Living Arrangements/Services ?Living arrangements for the past 2 months: Apartment ?Lives with:: Spouse, Adult Children ?Patient language and need for interpreter reviewed:: Yes ?Do you feel safe going back to the place  where you live?: Yes      ?Need for Family Participation in Patient Care: Yes (Comment) ?Care giver support system in place?: Yes (comment) (wife and son) ?  ?Criminal Activity/Legal Involvement Pertinent to Current Situation/Hospitalization: No - Comment as needed ? ?Activities of Daily Living ?  ?  ? ?Permission Sought/Granted ?Permission sought to share information with : Case Manager, Magazine features editor, Family Supports ?Permission granted to share information with : Yes, Verbal Permission Granted ? Share Information with NAME: Erubiel Manasco ? Permission granted to share info w AGENCY: SNF's ? Permission granted to share info w Relationship: Son ? Permission granted to share info w Contact Information: [phone] ? ?Emotional Assessment ?Appearance:: Appears stated age ?Attitude/Demeanor/Rapport: Lethargic ?  ?Orientation: : Oriented to Self ?Alcohol / Substance Use: Not Applicable ?Psych Involvement: No (comment) ? ?Admission diagnosis:  Pneumoperitoneum [K66.8] ?Peritonitis (HCC) [K65.9] ?Patient Active Problem List  ? Diagnosis Date Noted  ? Pressure injury of skin 01/06/2022  ? Acute respiratory failure with hypoxia (HCC) 01/05/2022  ? Malnutrition of moderate degree 01/03/2022  ? Hypothyroidism 01/03/2022  ? Hypotension 01/03/2022  ? Pneumoperitoneum 12/31/2021  ? ?PCP:  [facility] ?Pharmacy:   ?[facility] [address] [place] - [address] ?[address] ?[place] [address] ?Phone: [phone] Fax: [phone] ? ? [facility] - [place], [place] - [address] ?[phone] ?[place]  [place] [address] ?Phone: [phone] Fax: [phone] ? ? ? ? ?Social Determinants of Health (SDOH) Interventions ?  ? ?Readmission Risk Interventions ?   ? View : No data to display.  ?  ?  ?  ? ? ? ?

## 2022-01-06 NOTE — Progress Notes (Addendum)
PHARMACY - TOTAL PARENTERAL NUTRITION CONSULT NOTE  ? ?Indication: Intolerance to enteral feeding ? ?Assessment: 76 yo M w/ PMH of COPD, multiple sclerosis, diabetes, Barrett esophagus presenting to [facility] ED from home with complaints of abdominal pain in the setting of a suspected clogged PEG tube and coffee ground emesis since Thurs night 12/30/21.  ? ?Glucose / Insulin: q6h sSSI ?BG previous 24h: 127 - 153 (SSI required 5 units) ?Electrolytes: Potassium and phosphorus WNL; hypomagnesemia ?Renal: AKI resolved. Scr 0.72 ?Hepatic: Within normal limits ?Intake / Output; MIVF: NS PRN for IV medication delivery ?GI Imaging: ?GI Surgeries / Procedures:  ?03/18: Exploratory laparotomy ?03/22: GI series  ?No gastric leaks or fistulas found  ? ?Central access: PICC placed 01/01/22 ?TPN start date: 01/01/22 ? ?Nutritional Goals: ?Goal TPN rate is 70 mL/hr (provides 100 g of protein and 1900 kcals per day) ? ?RD Assessment:  ?Estimated Needs ?Total Energy Estimated Needs: 1800-2000 ?Total Protein Estimated Needs: 90-105g ?Total Fluid Estimated Needs: 1.4-1.6L/day ? ?Current Nutrition: ?Tube feeds as of 3/22.  ?Tolerating trickle feeding, advanced to goal ? ?Plan:  ?Reduce TPN to 35 mL/hr at 1800 (total volume including overfill 940 mL) ?Nutritional components ?Amino acids (using 10% Travasol): 56 grams ?Dextrose: 130 grams ?Lipids (using 20% SMOFlipids): 26.8 grams ?kCal (24h): 900 ?Electrolytes in TPN: Na 12mEq/L, K 15mEq/L, Ca 30mEq/L, Mg 75mEq/L, and Phos 57mmol/L. Cl:Ac 1:1 ?Gave Mg 2g x 1 IV for replenishment  ?Add standard MVI and trace elements to TPN ?Continue Sensitive SSI at q6h and adjust as needed  ?Fluid goals per surgery / primary team ?Monitor TPN labs on Mon/Thurs, daily until stable ? ?Lendon Ka, PharmD Candidate ?01/06/2022,10:31 AM ? ?

## 2022-01-06 NOTE — Care Plan (Addendum)
Patient in bed, alert but disoriented. Maintaining oxygen saturation on room air above 93%, patient has a weak, congested cough, with thick secretions orally. Attempt CPT on bed and patient repeatedly states "please stop! It hurts,  I am sick, please stop." CPT stopped after a few min and patient states he is not hurting anymore. Patient complaining of pain "everything hurts" only had dilaudid PRN. Spoke to surgery MD requesting additional pain medication as patient BP and respiratory status have been an issue in the past. (See orders and MAR). BP/HR WNL. Patient with large liquid BM this am, spoke with surgery, ok to place rectal tube. Admitting MD aware, rectal tube placed without complications. External urinary catheter leaked, but replaced and no longer leaking. Surgical dressings dry but obviously soiled with serous drainage, cleansed with sterile water and dry gauze, dry gauze dressing and tape applied over site. Patient turned Q2 hours, clean and dry with rectal tube in place. Oral care provided Q 2 hours.  ? ? ? ?Per secure chat with pharmacy and dietary, TPN was not compounded for PM tonight. Half rate of TPN this afternoon and advance TF as tolerated Q8HR. Continue TPN until supply is empty.  ?

## 2022-01-06 NOTE — Progress Notes (Signed)
Nutrition Follow Up Note  ? ?DOCUMENTATION CODES:  ? ?Non-severe (moderate) malnutrition in context of chronic illness ? ?INTERVENTION:  ? ?Continue TPN per pharmacy- plan is to decrease to half rate tonight  ? ?Osmolite 1.5_0 /hr- increase by 45m/hr q 8 hours until goal rate is reached.  ? ?Pro-Source 421mBID via tube, provides 40kcal and 11g of protein per serving  ? ?Free water flushes 3016m4 hours to maintain tube patency  ? ?Regimen provides 2060kcal/day, 105g/day protein and 1186m34my of free water.  ? ?Juven Fruit Punch BID via tube, each serving provides 95kcal and 2.5g of protein (amino acids glutamine and arginine) ? ?NUTRITION DIAGNOSIS:  ? ?Moderate Malnutrition related to chronic illness (MS, COPD, dysphagia) as evidenced by mild fat depletion, moderate fat depletion, moderate muscle depletion, severe muscle depletion. ? ?GOAL:  ? ?Patient will meet greater than or equal to 90% of their needs ?-met with TPN, progressing with tube feeds ? ?MONITOR:  ? ?Labs, Weight trends, TF tolerance, Skin, I & O's, TPN ? ?ASSESSMENT:  ? ?75 y4 male with h/o MS, Barrett's esophagus, dysphagia with chronic G-tube (since 11/2020), COPD, DM, GERD and hypothyroidism who is admitted with PNA, septic shock and peritonitis secondary to dislodged G-tube s/p exploratory laparotomy 3/18 (with takedown of gastrocolonic fistula with primary stapled repair of gastrostomy site and colotomy site, placement of new open feeding gastrostomy tube) ? ?Pt tolerated trickle feeds well overnight. Will plan to increase tube feeds to goal rate over the next 24-48 hours. Will decrease TPN to half rate this evening and plan to discontinue once pt gets to goal rate. No new weight since yesterday; pt is ordered for daily weights. Pt +7.2L on his I & Os.  ? ?Medications reviewed and include: lovenox, insulin, synthroid, protonix, diflucan, zosyn, TPN ? ?Labs reviewed: Na 139 wnl, K 3.7 wnl, BUN 27(H), P 2.8 wnl, Mg 1.5(L) ?Triglycerides- 47-  3/20 ?Hgb 9.0(L), Hct 27.7(L) ?Cbgs- 127, 144, 150, 151 x 24 hrs ? ?Diet Order:   ?Diet Order   ? ?       ?  Diet NPO time specified  Diet effective now       ?  ? ?  ?  ? ?  ? ?EDUCATION NEEDS:  ? ?Not appropriate for education at this time ? ?Skin:  Skin Assessment: Reviewed RN Assessment (incision abdomen, 0.5X.5cm dark purple area to the buttock) ? ?Last BM:  3/23- type 7 ? ?Height:  ? ?Ht Readings from Last 1 Encounters:  ?12/29/21 _1  (1.753 m)  ? ? ?Weight:  ? ?Wt Readings from Last 1 Encounters:  ?01/05/22 62.2 kg  ? ?BMI:  Body mass index is 20.25 kg/m?. ? ?Estimated Nutritional Needs:  ? ?Kcal:  1800-2000kcal/day  ? ?Protein:  90-105g/day  ? ?Fluid:  1.4-1.6L/day ? ?CaseKoleen Distance RD, LDN ?Please refer to AMION for RD and/or RD on-call/weekend/after hours pager ? ?

## 2022-01-06 NOTE — Progress Notes (Signed)
? ?                                                                                                                                                     ?                                                   ?Daily Progress Note  ? ?Patient Name: Mike Parks       Date: 01/06/2022 ?DOB: 1946-02-23  Age: 76 y.o. MRN#: [id number] ?Attending Physician: Lorella Nimrod, MD ?Primary Care Physician: [place] Date: 12/31/2021 ? ?Reason for Consultation/Follow-up: Establishing goals of care ? ?Patient Profile/HPI:  76 y.o. male  with past medical history of MS with [facility]ph[facility]ia [facility], Barre'ts esophagus, DM admitted on 12/31/2021 with sepsis GI tube dislodged and peritonitis. Recovery complicated by pneumonia with pleural effusions. He has been weaned off pressors and is currently on room air. GI series has confirmed placement of PEG and he has been started on trickle tube feedings.  ? ?Subjective: ?Chart reviewed including progress notes labs and imaging. ?Mike Parks is awake but very weak.  He arouses easily to my touch. ?He is oriented to place and situation, he tells me he is in the hospital because he gets sick.  I reviewed with him his hospital course. ?I discussed with him the conversation I had with his son Mike Parks yesterday.  Patient noted during our conversation how very much he loves his son. 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  Patient confirmed this   ?I encouraged patient to continue to share his goals of care with his family and to discuss treatment decisions within the context of those goals of care also discussed with him and his quality of life came to a point that was unlivable for him, there was always an option moving to comfort measures  only providing symptom management. Patient verbalized understanding and was thankful for conversation.   ? ? ?Physical Exam ?Vitals and nursing note reviewed.  ?Constitutional:   ?   Appearance: He is ill-appearing.  ?Pulmonary:  ?   Effort: Pulmonary effort is normal.  ?   Comments: cough ?Neurological:  ?   Comments: Aroused easily, stronger vocal quality today, oriented x3  ?         ? ?Vital Signs: BP (!) 92/51 (BP Location: Left Arm)   Pulse 62  Temp 99 ?F (37.2 ?C) (Axillary)   Resp (!) 30   Wt 62.2 kg   SpO2 100%   BMI 20.25 kg/m?  ?SpO2: SpO2: 100 % ?O2 Device: O2 Device: Room Air ?O2 Flow Rate: O2 Flow Rate (L/min): 2 L/min ? ?Intake/output summary:  ?Intake/Output Summary (Last 24 hours) at 01/06/2022 1253 ?Last data filed at 01/06/2022 1231 ?Gross per 24 hour  ?Intake 2577.41 ml  ?Output 1625 ml  ?Net 952.41 ml  ? ?LBM: Last BM Date : 01/06/22 ?Baseline Weight: Weight: 62.2 kg ?Most recent weight: Weight: 62.2 kg ? ?     ?Palliative Assessment/Data: PPS: 20% ? ? ? ? ? ?Patient Active Problem List  ? Diagnosis Date Noted  ? Pressure injury of skin 01/06/2022  ? Acute respiratory failure with hypoxia ([place]) 01/05/2022  ? Malnutrition of moderate degree 01/03/2022  ? Hypothyroidism 01/03/2022  ? Hypotension 01/03/2022  ? Pneumoperitoneum 12/31/2021  ? ? ?Palliative Care Assessment & Plan  ? ? ?Assessment/Recommendations/Plan ? ?Sepsis related to edema peritonitis, pneumonia, continues to improve, goals of care have been discussed that he wishes to continue all life prolonging measures as long as he is awake and able to interact with family he is amenable to going to a nursing facility for care ? ? ?Code Status: ?DNR ? ?Prognosis: ? Unable to determine ? ?Discharge Planning: ?To Be Determined ? ? ? ? ?Mariana Kaufman, AGNP-C ?Palliative Medicine ? ? ?Please contact Palliative Medicine Team phone at [phone] for questions and concerns.  ? ? ? ? ? ? ?

## 2022-01-06 NOTE — Progress Notes (Signed)
PHARMACIST - PHYSICIAN COMMUNICATION ? ?CONCERNING: IV to Oral Route Change Policy ? ?RECOMMENDATION: ?This patient is receiving pantoprazole by the intravenous route.  Based on criteria approved by the Pharmacy and Therapeutics Committee, the intravenous medication(s) is/are being converted to the equivalent oral dose form(s). ? ? ?DESCRIPTION: ?These criteria include: ?The patient is eating (either orally or via tube) and/or has been taking other orally administered medications for a least 24 hours ?The patient has no evidence of active gastrointestinal bleeding or impaired GI absorption (gastrectomy, short bowel, patient on TNA or NPO). ? ?If you have questions about this conversion, please contact the Pharmacy Department  ? ?Tressie Ellis, RPH ?01/06/2022 9:22 AM  ?

## 2022-01-06 NOTE — Assessment & Plan Note (Signed)
Blood pressure within goal now and we were able to wean him off from pressor. ?-Monitor blood pressure ?

## 2022-01-06 NOTE — Progress Notes (Signed)
[facility] ?SURGICAL PROGRESS NOTE ? ?Hospital Day(s): 6.  ? ?Post op day(s): 6 Days Post-Op.  ? ?Interval History:  ?Patient seen and examined ?No acute events or new complaints overnight.  ?Patient is somnolent; groans to verbal and painful stimuli ?Previously seen leukocytosis is resolved; now 9.2K ?Hgb stable at 9.0 ?Renal function is normal; sCr - 0.72; UO - 1.4L ?Mild hypomagnesemia to 1.5 ?LLQ drain with 105 ccs out; serous ?RLQ drain with 40 ccs out; serous  ?NGT removed prior to UGI yesterday  ?He is having bowel function ?Trickle feedings started via G-tube yesterday (03/22) after UGI was without leak ? ?Vital signs in last 24 hours: [min-max] current  ?Temp:  [97.6 ?F (36.4 ?C)-99.7 ?F (37.6 ?C)] 98.7 ?F (37.1 ?C) (03/23 0400) ?Pulse Rate:  [51-70] 59 (03/23 0600) ?Resp:  [17-32] 26 (03/23 0600) ?BP: (68-122)/(47-74) 101/63 (03/23 0600) ?SpO2:  [95 %-100 %] 96 % (03/23 0600)       Weight: 62.2 kg BMI (Calculated): 20.24  ? ?Intake/Output last 2 shifts:  ?03/22 0701 - 03/23 0700 ?In: 2039.7 [I.V.:1502.5; NG/GT:105; IV Piggyback:432.1] ?Out: 1445 [Urine:1300; Drains:145]  ? ?Physical Exam:  ?Constitutional: Somnolent; groans to verbal and painful stimuli but does not participate this morning  ?Respiratory: breathing non-labored at rest  ?Cardiovascular: regular rate and sinus rhythm  ?Gastrointestinal: Soft, abdominal soreness, non-distended, surgical drains in RLQ/LLQ both with serous output, gastrostomy tube in place; site CDI ? ?Labs:  ? ?  Latest Ref Rng & Units 01/06/2022  ?  4:47 AM 01/03/2022  ?  5:17 AM 01/02/2022  ?  6:24 AM  ?CBC  ?WBC 4.0 - 10.5 K/uL 9.2   10.6   12.1    ?Hemoglobin 13.0 - 17.0 g/dL 9.0   9.3   9.2    ?Hematocrit 39.0 - 52.0 % 27.7   28.3   28.1    ?Platelets 150 - 400 K/uL 178   156   166    ? ? ?  Latest Ref Rng & Units 01/06/2022  ?  4:47 AM 01/05/2022  ?  4:33 AM 01/04/2022  ?  6:25 AM  ?CMP  ?Glucose 70 - 99 mg/dL 326   712   458    ?BUN 8 - 23 mg/dL 27   27   29      ?Creatinine 0.61 - 1.24 mg/dL   0.99   8.33    ?Sodium 135 - 145 mmol/L 139   137   138    ?Potassium 3.5 - 5.1 mmol/L 3.7   3.5   3.2    ?Chloride 98 - 111 mmol/L 107   107   105    ?CO2 22 - 32 mmol/L 27   28   26     ?Calcium 8.9 - 10.3 mg/dL 7.7   7.7   7.7    ?Total Protein 6.5 - 8.1 g/dL 5.0      ?Total Bilirubin 0.3 - 1.2 mg/dL 0.3      ?Alkaline Phos 38 - 126 U/L 48      ?AST 15 - 41 U/L 51      ?ALT 0 - 44 U/L 55      ? ? ? ?Imaging studies: No new pertinent imaging studies; UGI pending  ? ? ?Assessment/Plan: ?76 y.o. male 6 Days Post-Op s/p exploratory laparotomy, takedown of gastrocolonic fistula with primary stapled repair of gastrostomy site and colotomy site, placement of new open feeding gastrostomy tube ? ? - Okay to place stool  management device from surgery perspective  ? - Advance tube feedings as tolerated ? - Continue TPN until at goal enteric feedings ? - Monitor abdominal examination ?- Continue surgical drains; serous ? - Pain control prn  ? - Further management per primary service; we will follow   ? ?All of the above findings and recommendations were discussed with the patient, p, and the medical team. ? ?-- ?Lynden Oxford, PA-C ?[facility] ?01/06/2022, 7:07 AM ?[phone] ?M-F: 7am - 4pm ? ?

## 2022-01-06 NOTE — Consult Note (Addendum)
WOC Nurse Consult Note: ?Reason for Consult: Consult requested for buttocks.  Pt has red macerated skin with partial thickness skin loss to the inner gluteal fold and generalized erythemia; appearance is consistent with moisture associated skin damage. There is a .5X.5cm dark purple area to the buttock; appearance and location are consistent with a bruise, not a pressure injury.  ? ?ICD-10 CM Codes for Irritant Dermatitis ?Z66A6 - Due to fecal, urinary or dual incontinence ? ?Dressing procedure/placement/frequency: Pt is on a low airloss mattress to reduce pressure. Topical treatment orders provided for bedside nurses to perform as follows to protect skin and repel moisture: Apply Desitin to buttocks/gluteal fold TID and PRN when turning or cleaning. ?Please re-consult if further assistance is needed.  Thank-you,  ?Cammie Mcgee MSN, RN, CWOCN, [place], CNS ?[phone]  ?  ?

## 2022-01-06 NOTE — Assessment & Plan Note (Signed)
Secondary to dislodged intraperitoneal gastrostomy tube s/p exploratory laparotomy, repair, insertion of new gastrostomy tube and takedown of gastrocolonic fistula. ?Decided to discontinue TPN secondary to having some difficulty getting it from [facility].  Patient is tolerating trickle feed well. ?GI series with no leak-surgery is planning to start trickle feed through G-tube. ?-Continue with enteric feeding-rate was increased with the hope to reach to his goal by tomorrow. ?-Continue with Zosyn and Diflucan-being managed by surgery, they wanted for 7 days. ?-Continue with PPI IV twice daily. ?-Continue with supportive care ? ? ?

## 2022-01-06 NOTE — Progress Notes (Signed)
Brief Pharmacy Note ? ?Received notification that there was issue compounding TPN today so no bag will arrive for this evening. Patient is currently on tube feeds and is tolerating advancement so far. Anticipate patient will be at goal rate tomorrow. Treatment team notified.  ? ?Mike Parks ?01/06/22 ? ?

## 2022-01-06 NOTE — Plan of Care (Signed)
VSS. Afebrile. Pt requiring frequent suctioning for oral secretions. Pt frequently calling for help but unable to make specific needs known. Pt did not sleep well overnight. Frequent oral care, q2hr turns provided. Pt endorses moderate pain "everywhere"; PRN oxy givenx1. TPN ended, no new bag to be hung per previous RN handoff relaying discussion between pharmacy and dietary. Advancing TF; tolerating well.  ?Problem: Education: ?Goal: Knowledge of General Education information will improve ?Description: Including pain rating scale, medication(s)/side effects and non-pharmacologic comfort measures ?Outcome: Progressing ?  ?Problem: Health Behavior/Discharge Planning: ?Goal: Ability to manage health-related needs will improve ?Outcome: Progressing ?  ?Problem: Clinical Measurements: ?Goal: Ability to maintain clinical measurements within normal limits will improve ?Outcome: Progressing ?Goal: Will remain free from infection ?Outcome: Progressing ?Goal: Diagnostic test results will improve ?Outcome: Progressing ?Goal: Respiratory complications will improve ?Outcome: Progressing ?Goal: Cardiovascular complication will be avoided ?Outcome: Progressing ?  ?Problem: Activity: ?Goal: Risk for activity intolerance will decrease ?Outcome: Progressing ?  ?Problem: Nutrition: ?Goal: Adequate nutrition will be maintained ?Outcome: Progressing ?  ?Problem: Coping: ?Goal: Level of anxiety will decrease ?Outcome: Progressing ?  ?Problem: Elimination: ?Goal: Will not experience complications related to bowel motility ?Outcome: Progressing ?Goal: Will not experience complications related to urinary retention ?Outcome: Progressing ?  ?Problem: Pain Managment: ?Goal: General experience of comfort will improve ?Outcome: Progressing ?  ?Problem: Safety: ?Goal: Ability to remain free from injury will improve ?Outcome: Progressing ?  ?Problem: Skin Integrity: ?Goal: Risk for impaired skin integrity will decrease ?Outcome: Progressing ?  ?

## 2022-01-06 NOTE — Progress Notes (Addendum)
Physical Therapy Treatment ?Patient Details ?Name: Mike Parks ?MRN: [id number] ?DOB: 02-18-1946 ?Today's Date: 01/06/2022 ? ? ?History of Present Illness Pt is a 76 yo M with past medical history of multiple sclerosis, Barrett esophagus, dysphagia s/p PEG tube placement 11/2020, DM, COPD, hypothyroid, GERD, and overactive bladder who presented at [facility] ED from home with complaints of abdominal pain.  Pt diagnosed with free air and free fluid due to malposition of feeding tube placed in the peritoneal cavity but outside of the intestine or stomach. Pt now s/p exploratory laparotomy with takedown of gastrocolic fistula and placement of new gastrostomy feeding tube. ? ?  ?PT Comments  ? ? The patient was agreeable to PT. He is making progress with functional independence and with increased activity tolerance this session. He was able to stand x 3 bouts, take some side steps along edge of bed, and march in place with assistance. Standing activity tolerance limited to ~ 2 minutes. Intermittent +2 person assistance required initially but quickly progressed to only needed one person for transfers. No significant change in vitals noted with activity and patient on room air. Recommend to continue PT to maximize independence and facilitate return to prior level of function. Continue to recommend SNF placement as patient does not appear to be at his baseline level of functional mobility.  ? ?  ?Recommendations for follow up therapy are one component of a multi-disciplinary discharge planning process, led by the attending physician.  Recommendations may be updated based on patient status, additional functional criteria and insurance authorization. ? ?Follow Up Recommendations ? Skilled nursing-short term rehab (<3 hours/day) ?  ?  ?Assistance Recommended at Discharge Frequent or constant Supervision/Assistance  ?Patient can return home with the following Two people to help with walking and/or transfers;Two people to help  with bathing/dressing/bathroom;Direct supervision/assist for medications management;Assistance with cooking/housework;Assist for transportation;Help with stairs or ramp for entrance ?  ?Equipment Recommendations ?  (to be determined at next level of care)  ?  ?Recommendations for Other Services   ? ? ?  ?Precautions / Restrictions Precautions ?Precautions: Fall ?Restrictions ?Weight Bearing Restrictions: No  ?  ? ?Mobility ? Bed Mobility ?Overal bed mobility: Modified Independent ?Bed Mobility: Supine to Sit, Sit to Supine ?Rolling: Max assist ?  ?Supine to sit: Max assist ?Sit to supine: Max assist ?  ?General bed mobility comments: assistance for LE and trunk support. verbal cues for logroll technique to protect abdominal incision. increased time and effort required with mobility. ?  ? ?Transfers ?Overall transfer level: Needs assistance ?Equipment used: 2 person hand held assist ?Transfers: Sit to/from Stand ?Sit to Stand: Mod assist, Min assist ?  ?  ?  ?  ?  ?General transfer comment: 3 bouts of standing peformed. patient required less assistance with each standing bout. initially required +2 progressing to +1. verbal cues for hand placement and technique. ?  ? ?Ambulation/Gait ?Ambulation/Gait assistance: Mod assist, +2 physical assistance ?Gait Distance (Feet): 2 Feet ?Assistive device: 2 person hand held assist ?  ?  ?  ?Pre-gait activities: marching in place performed with cues for upright posture x less than 30 seconds. activity tolerance limited by fatigue in standing ?General Gait Details: patient able to weight bear through BLE without buckling and take several side steps along edge of bed with assistance. standing tolerance limited to ~2 minutes. no significant change in vitals noted with upright activity. patient could likely attempt ambulation away from the bed next session with chair follow for safety. he does  report ambulation is limited at baseline. ? ? ?Stairs ?  ?  ?  ?  ?  ? ? ?Wheelchair  Mobility ?  ? ?Modified Rankin (Stroke Patients Only) ?  ? ? ?  ?Balance Overall balance assessment: Needs assistance ?Sitting-balance support: Feet supported ?Sitting balance-Leahy Scale: Fair ?Sitting balance - Comments: no physical assistance required. close stand by assistance for safety ?  ?Standing balance support: Bilateral upper extremity supported ?Standing balance-Leahy Scale: Poor ?Standing balance comment: Min A required, intermittent +2 assistance initially progressing to +1 ?  ?  ?  ?  ?  ?  ?  ?  ?  ?  ?  ?  ? ?  ?Cognition Arousal/Alertness: Lethargic ?Behavior During Therapy: Flat affect ?Overall Cognitive Status: Difficult to assess ?  ?  ?  ?  ?  ?  ?  ?  ?  ?  ?  ?  ?  ?  ?  ?  ?General Comments: difficulty understanding him at times, soft speech. he is able to follow single step commands consistently with extra time. ?  ?  ? ?  ?Exercises   ? ?  ?General Comments   ?  ?  ? ?Pertinent Vitals/Pain Pain Assessment ?Pain Assessment: Faces ?Faces Pain Scale: Hurts a little bit ?Pain Location: abdomen with movement ?Pain Descriptors / Indicators: Sore ?Pain Intervention(s): Limited activity within patient's tolerance, Repositioned  ? ? ?Home Living   ?  ?  ?  ?  ?  ?  ?  ?  ?  ?   ?  ?Prior Function    ?  ?  ?   ? ?PT Goals (current goals can now be found in the care plan section) Acute Rehab PT Goals ?Patient Stated Goal: to get better ?PT Goal Formulation: With patient ?Time For Goal Achievement: 01/17/22 ?Potential to Achieve Goals: Fair ?Progress towards PT goals: Progressing toward goals ? ?  ?Frequency ? ? ? Min 2X/week ? ? ? ?  ?PT Plan Current plan remains appropriate  ? ? ?Co-evaluation   ?  ?  ?  ?  ? ?  ?AM-PAC PT "6 Clicks" Mobility   ?Outcome Measure ? Help needed turning from your back to your side while in a flat bed without using bedrails?: A Lot ?Help needed moving from lying on your back to sitting on the side of a flat bed without using bedrails?: A Lot ?Help needed moving to and  from a bed to a chair (including a wheelchair)?: A Lot ?Help needed standing up from a chair using your arms (e.g., wheelchair or bedside chair)?: A Lot ?Help needed to walk in hospital room?: A Lot ?Help needed climbing 3-5 steps with a railing? : Total ?6 Click Score: 11 ? ?  ?End of Session   ?Activity Tolerance: Patient tolerated treatment well ?Patient left: in bed;with call bell/phone within reach;with bed alarm set;with SCD's reapplied; all drains/lines/tubes appear intact with good positioning at end of session ?  ?PT Visit Diagnosis: Muscle weakness (generalized) (M62.81);Difficulty in walking, not elsewhere classified (R26.2) ?  ? ? ?Time: 1610-9604 ?PT Time Calculation (min) (ACUTE ONLY): 19 min ? ?Charges:  $Therapeutic Activity: 8-22 mins          ?          ? ?Donna Bernard, PT, MPT ? ? ? ?[facility] ?01/06/2022, 1:59 PM ? ?

## 2022-01-06 NOTE — Progress Notes (Signed)
CPT started and pt requested to stop. ?

## 2022-01-06 NOTE — Progress Notes (Signed)
?Progress Note ? ? ?PatientMarland Kitchen Mike Parks DHR:[id number] DOB: 1946-08-14 DOA: 12/31/2021     6 ?DOS: the patient was seen and examined on 01/06/2022 ?  ?Brief hospital course: ?Taken from prior notes. ? ?76 yo M with past medical history of multiple sclerosis, Barrett esophagus, dysphagia s/p PEG tube placement 11/2020, T2DM, COPD, hypothyroid, GERD, overactive bladder, as reviewed from EMR, presented at [facility] ED from home with complaints of abdominal pain in the setting of a suspected clogged PEG tube and coffee ground emesis since Thurs night 12/30/21. Patient had come to the ED on 12/29/21 because his PEG tube had become dislodged. Per chart review it appears as though the PEG tube was replaced bedside, no confirmatory imaging noted. Per Dr. Adelene Idler documentation & via telephone discussion with the patient's son, abdominal pain that began the evening of 12/29/21. The patient's son reported this abdominal pain worsened on 12/30/21 and then the patient began having coffee-ground emesis on the morning of 12/31/21. Per son, the patient has been able to take all his medications as prescribed. ?  ?ED course: ?Upon arrival the patient was hypotensive & tachycardic, spitting up foamy phlegm with coffee-ground material. CT imaging shows malposition of the PEG tube in the peritoneal cavity, not inside the intestine/stomach.  Pneumoperitoneum was also noted.  Surgery was contacted for admission and emergent intervention. ? ?Patient was taken to the OR for exploratory laparotomy on 12/31/2021.  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  Patient was taken to ICU postoperatively due to peritonitis and possible for perforation. ? ?He is currently weaned off from pressors. ? ?Worsening respiratory status, currently on heated high flow at 45 L of oxygen. ? ?He was also placed on TPN until bowel functions resume. ?He is  currently on Zosyn and Diflucan. ?Palliative care was also consulted to discuss goals of care. ?Very high risk for deterioration and death. ? ?2023-01-14; patient was started on low-dose Levophed due to persistent hypotension not responding to IV fluid.  Currently blood pressure stable.  Clinically seems the same.  General surgery is going to do GI series tomorrow via NG tube.  Palliative care was also consulted and a family meeting is planned for tomorrow around noon.  We will continue current level of care at this time. ? ?3/22: Patient had upper GI series which was negative for any leak, surgery is planning to start trickle feed through G-tube. ?Palliative care meeting with family, patient is not DNR with full scope of medical care.  If he deteriorates then they are open to discuss about comfort care but would like to continue current level of care at this time. ?Remains very lethargic but off the pressors now, able to wean off to 2 L of oxygen. ? ?3/23: Clinically seems improving.  Still feeling weak.  Able to wean off to room air now. ?There was some trouble getting TPN from [facility], currently on trickle enteric feed and will reach his goal feeding by tomorrow.  Also having some mild diarrhea. ? ? ?Assessment and Plan: ?* Pneumoperitoneum ?Secondary to dislodged intraperitoneal gastrostomy tube s/p exploratory laparotomy, repair, insertion of new gastrostomy tube and takedown of gastrocolonic fistula. ?Decided to discontinue TPN secondary to having some difficulty getting it from [facility].  Patient is tolerating trickle feed well. ?GI series with no leak-surgery is planning to start trickle feed through G-tube. ?-Continue with enteric feeding-rate was increased with the hope to  reach to his goal by tomorrow. ?-Continue with Zosyn and Diflucan-being managed by surgery, they wanted for 7 days. ?-Continue with PPI IV twice daily. ?-Continue with supportive care ? ? ? ?Acute respiratory failure with hypoxia  (HCC) ?Resolved-patient is now on room air ?No baseline oxygen use. ?-Continue with supplemental oxygen- wean further as tolerated. ?-Continue with supportive care ? ?Hypotension ?Blood pressure within goal now and we were able to wean him off from pressor. ?-Monitor blood pressure ? ?Malnutrition of moderate degree ?Estimated body mass index is 17.72 kg/m? as calculated from the following: ?  Height as of 12/29/21: 5\' 9"  (1.753 m). ?  Weight as of 12/29/21: 54.4 kg.  ? ?-Currently on TPN ? ?Hypothyroidism ?Home Synthroid was initially held as patient is currently n.p.o. ?-Start him on IV Synthroid ? ?  ? ?Subjective: Patient was still feeling weak and having some abdominal pain.  Appears more alert and trying to talk today. ? ?Physical Exam: ?Vitals:  ? 01/06/22 1300 01/06/22 1400 01/06/22 1500 01/06/22 1505  ?BP: 124/72 99/79 (!) 79/53 92/61  ?Pulse: 63 65 63 66  ?Resp: (!) 24 (!) 21 (!) 23 (!) 21  ?Temp:      ?TempSrc:      ?SpO2: 90% 99% 99% 100%  ?Weight:      ? ?General.  Ill-appearing elderly man, in no acute distress. ?Pulmonary.  Lungs clear bilaterally, normal respiratory effort. ?CV.  Regular rate and rhythm, no JVD, rub or murmur. ?Abdomen.  Soft, nontender, nondistended, BS positive.  To drain NG tube in place. ?CNS.  Alert and oriented .  No focal neurologic deficit. ?Extremities.  No edema, no cyanosis, pulses intact and symmetrical. ?Psychiatry.  Judgment and insight appears impaired. ? ?Data Reviewed: ?Prior notes and labs reviewed ? ?Family Communication:  ? ?Disposition: ?Status is: Inpatient ?Remains inpatient appropriate because: Severity of illness ? ? Planned Discharge Destination: Home with Home Health and Skilled nursing facility ? ?DVT prophylaxis.  Lovenox ? ?Time spent: 45 minutes ? ?This record has been created using 01-10-1989. Errors have been sought and corrected,but may not always be located. Such creation errors do not reflect on the standard of  care. ? ?Author: ?Conservation officer, historic buildings, MD ?01/06/2022 3:49 PM ? ?For on call review [email or web address].  ?

## 2022-01-06 NOTE — Assessment & Plan Note (Signed)
Resolved-patient is now on room air ?No baseline oxygen use. ?-Continue with supplemental oxygen- wean further as tolerated. ?-Continue with supportive care ?

## 2022-01-07 ENCOUNTER — Other Ambulatory Visit: Payer: Self-pay

## 2022-01-07 DIAGNOSIS — R197 Diarrhea, unspecified: Secondary | ICD-10-CM | POA: Diagnosis not present

## 2022-01-07 DIAGNOSIS — K668 Other specified disorders of peritoneum: Secondary | ICD-10-CM | POA: Diagnosis not present

## 2022-01-07 DIAGNOSIS — I480 Paroxysmal atrial fibrillation: Secondary | ICD-10-CM

## 2022-01-07 LAB — BASIC METABOLIC PANEL
Anion gap: 7 (ref 5–15)
BUN: 24 mg/dL — ABNORMAL HIGH (ref 8–23)
CO2: 24 mmol/L (ref 22–32)
Calcium: 7.2 mg/dL — ABNORMAL LOW (ref 8.9–10.3)
Chloride: 109 mmol/L (ref 98–111)
Creatinine, Ser: 0.84 mg/dL (ref 0.61–1.24)
GFR, Estimated: >60 mL/min (ref >60)
Glucose, Bld: 136 mg/dL — ABNORMAL HIGH (ref 70–99)
Potassium: 3.9 mmol/L (ref 3.5–5.1)
Sodium: 140 mmol/L (ref 135–145)

## 2022-01-07 LAB — ALBUMIN: Albumin: 1.6 g/dL — ABNORMAL LOW (ref 3.5–5.0)

## 2022-01-07 LAB — GLUCOSE, CAPILLARY
Glucose-Capillary: 114 mg/dL — ABNORMAL HIGH (ref 70–99)
Glucose-Capillary: 119 mg/dL — ABNORMAL HIGH (ref 70–99)
Glucose-Capillary: 131 mg/dL — ABNORMAL HIGH (ref 70–99)
Glucose-Capillary: 139 mg/dL — ABNORMAL HIGH (ref 70–99)

## 2022-01-07 LAB — MAGNESIUM: Magnesium: 1.6 mg/dL — ABNORMAL LOW (ref 1.7–2.4)

## 2022-01-07 MED ORDER — OSMOLITE 1.5 CAL PO LIQD
1000.0000 mL | ORAL | Status: DC
  Administered 2022-01-08: 1000 mL

## 2022-01-07 MED ORDER — MAGNESIUM SULFATE 2 GM/50ML IV SOLN
2.0000 g | Freq: Once | INTRAVENOUS | Status: AC
  Administered 2022-01-07: 2 g via INTRAVENOUS
  Filled 2022-01-07: qty 50

## 2022-01-07 MED ORDER — FREE WATER
140.0000 mL | Status: DC
  Administered 2022-01-07 – 2022-01-09 (×11): 140 mL

## 2022-01-07 MED ORDER — QUETIAPINE FUMARATE 25 MG PO TABS
25.0000 mg | ORAL_TABLET | Freq: Two times a day (BID) | ORAL | Status: DC
  Administered 2022-01-07 – 2022-01-15 (×16): 25 mg
  Filled 2022-01-07 (×16): qty 1

## 2022-01-07 MED ORDER — GUAIFENESIN 100 MG/5ML PO LIQD
15.0000 mL | ORAL | Status: DC | PRN
  Administered 2022-01-07 – 2022-01-13 (×12): 15 mL
  Filled 2022-01-07 (×12): qty 20

## 2022-01-07 MED ORDER — SODIUM CHLORIDE 0.9 % IV BOLUS: 500.0000 mL | Freq: Once | INTRAVENOUS | Status: DC

## 2022-01-07 MED ORDER — GUAIFENESIN 100 MG/5ML PO LIQD
15.0000 mL | ORAL | Status: DC | PRN
  Administered 2022-01-07: 15 mL via ORAL
  Filled 2022-01-07: qty 15

## 2022-01-07 NOTE — Assessment & Plan Note (Signed)
Resolved-patient is now on room air ?No baseline oxygen use. ?-Continue with supplemental oxygen- wean further as tolerated. ?-Continue with supportive care ?

## 2022-01-07 NOTE — Progress Notes (Signed)
Called son [name] and notified of patient moving to room 236.  Answered all his questions and updated on patient's condition.   ?

## 2022-01-07 NOTE — Progress Notes (Signed)
OT Cancellation Note ? ?Patient Details ?Name: NYLES MITTON ?MRN: [id number] ?DOB: [birth year] ? ? ?Cancelled Treatment:    Reason Eval/Treat Not Completed: Other (comment) (RN requests defer OT eval and to let pt rest on this date. OT will re-attempt as able.) ?Oleta Mouse, OTD OTR/L  ?01/07/22, 12:58 PM  ?

## 2022-01-07 NOTE — Progress Notes (Signed)
Pt just arrived 30 min ago to floor. BP in 70's over 50's. Dr. Nelson Chimes notified and 500cc bolus administered. Will notify Dr. Nelson Chimes of pt's bp after bolus. ?

## 2022-01-07 NOTE — Progress Notes (Signed)
Pt moaning in pain. Unable to tolerate or cooperate with CPT ?Unable to perform flutter valve.  He does have a decent spontaneous cough. ?

## 2022-01-07 NOTE — Assessment & Plan Note (Signed)
Patient developed diarrhea requiring rectal seal after starting enteric feeding, might be related to formula.  No worsening abdominal pain or leukocytosis. ?-Message was sent to dietitian-they are trying to observe her for next 1 to 2 days before making any changes.  No need to add fiber at this time. ?-Continue to monitor ?

## 2022-01-07 NOTE — Assessment & Plan Note (Signed)
Secondary to dislodged intraperitoneal gastrostomy tube s/p exploratory laparotomy, repair, insertion of new gastrostomy tube and takedown of gastrocolonic fistula. ?GI series with no leak- ?Patient was started on enteric feeding with G-tube and rate will be progress to goal today. ?-Keep holding TPN ?-Continue with Zosyn and Diflucan-being managed by surgery, they wanted for 7 days. ?-Continue with PPI IV twice daily. ?-Continue with supportive care ? ? ?

## 2022-01-07 NOTE — Progress Notes (Signed)
After fluid bolus, pt's bp came up  to 90's systolic but now starting to head back down. Currently 86/57. Notified Dr. Nelson Chimes.  ? ?Palliative nurse called about an hour ago and said they're hands are tied in terms of what they can do to help b/c they are in [place] this evening and are off over the weekend. They suggest primary reach back out to family if pt starts to decline again and make him comfort care if family is agreeable. When pt's bp started going back down, I notified Dr. Nelson Chimes of all. ?

## 2022-01-07 NOTE — Progress Notes (Signed)
PT Cancellation Note ? ?Patient Details ?Name: Mike Parks ?MRN: [id number] ?DOB: Mar 06, 1946 ? ? ?Cancelled Treatment:     Per RN,  Pt is not appropriate to participate in therapy today.  Acute PT will continue to follow and progress as able per current POC.  ? ? ?Rushie Chestnut ?01/07/2022, 12:53 PM ?

## 2022-01-07 NOTE — Assessment & Plan Note (Signed)
Patient developed a very transient episode of atrial fibrillation.  No prior history.  Remained rate controlled and no hemodynamic compromise. ?Spontaneously reverted back to sinus rhythm. ?-Continue to monitor. ?-No need for any anticoagulation at this time as patient is high risk for bleeding due to recent perforated ulcer and surgeries. ?

## 2022-01-07 NOTE — Progress Notes (Signed)
[facility] ?SURGICAL PROGRESS NOTE ? ?Hospital Day(s): 7.  ? ?Post op day(s): 7 Days Post-Op.  ? ?Interval History:  ?Patient seen and examined ?No acute events or new complaints overnight.  ?Patient seen with RN at bedside. Constantly yelling "help, help." When asked to illicit what is wrong he states "everything." ?He does have productive cough this morning; suctioning yellow sputum. Does not appear to be tube feedings. ?LLQ drain with 130 ccs out; serous ?RLQ drain with 65ccs out; serous  ?He is having bowel function ?Trickle feedings started via G-tube on 03/22 after UGI was without leak; 45 ml/hr ?TPN discontinued ? ?Vital signs in last 24 hours: [min-max] current  ?Temp:  [98.4 ?F (36.9 ?C)-100.3 ?F (37.9 ?C)] 98.7 ?F (37.1 ?C) (03/24 0400) ?Pulse Rate:  [57-81] 76 (03/24 0600) ?Resp:  [15-35] 15 (03/24 0600) ?BP: (79-124)/(49-79) 93/60 (03/24 0600) ?SpO2:  [90 %-100 %] 94 % (03/24 0600)       Weight: 62.2 kg BMI (Calculated): 20.24  ? ?Intake/Output last 2 shifts:  ?03/23 0701 - 03/24 0700 ?In: 2222.9 [I.V.:960.2; NG/GT:615; IV Piggyback:377.6] ?Out: 1595 [Urine:1075; Drains:195; Stool:325]  ? ?Physical Exam:  ?Constitutional: Alert, cooperative ?Respiratory: audible upper air way sounds; suctioning yellow sputum  ?Cardiovascular: regular rate and sinus rhythm  ?Gastrointestinal: Soft, abdominal soreness, non-distended, surgical drains in RLQ/LLQ both with serous output, gastrostomy tube in place; site CDI ? ?Labs:  ? ?  Latest Ref Rng & Units 01/06/2022  ?  4:47 AM 01/03/2022  ?  5:17 AM 01/02/2022  ?  6:24 AM  ?CBC  ?WBC 4.0 - 10.5 K/uL 9.2   10.6   12.1    ?Hemoglobin 13.0 - 17.0 g/dL 9.0   9.3   9.2    ?Hematocrit 39.0 - 52.0 % 27.7   28.3   28.1    ?Platelets 150 - 400 K/uL 178   156   166    ? ? ?  Latest Ref Rng & Units 01/07/2022  ?  6:33 AM 01/06/2022  ?  4:47 AM 01/05/2022  ?  4:33 AM  ?CMP  ?Glucose 70 - 99 mg/dL 790   383   338    ?BUN 8 - 23 mg/dL 24   27   27     ?Creatinine 0.61 -  1.24 mg/dL   3.29   1.91    ?Sodium 135 - 145 mmol/L 140   139   137    ?Potassium 3.5 - 5.1 mmol/L 3.9   3.7   3.5    ?Chloride 98 - 111 mmol/L 109   107   107    ?CO2 22 - 32 mmol/L 24   27   28     ?Calcium 8.9 - 10.3 mg/dL 7.2   7.7   7.7    ?Total Protein 6.5 - 8.1 g/dL  5.0     ?Total Bilirubin 0.3 - 1.2 mg/dL  0.3     ?Alkaline Phos 38 - 126 U/L  48     ?AST 15 - 41 U/L  51     ?ALT 0 - 44 U/L  55     ? ? ? ?Imaging studies: No new pertinent imaging studies; UGI pending  ? ? ?Assessment/Plan: ?76 y.o. male 7 Days Post-Op s/p exploratory laparotomy, takedown of gastrocolonic fistula with primary stapled repair of gastrostomy site and colotomy site, placement of new open feeding gastrostomy tube ? ? - Okay to continue to advance TF to goal as tolerated ? -  Can continue to hold off on restarting TPN ? - Monitor abdominal examination ?- Continue surgical drains; serous; monitor and record output ? - Pain control prn  ? - Further management per primary service ? ?All of the above findings and recommendations were discussed with the patient, p, and the medical team. ? ?-- ?Lynden Oxford, PA-C ?[facility] ?01/07/2022, 7:39 AM ?[phone] ?M-F: 7am - 4pm ? ?

## 2022-01-07 NOTE — TOC Progression Note (Signed)
Transition of Care (TOC) - Progression Note  ? ? ?Patient Details  ?Name: Mike Parks ?MRN: [id number] ?Date of Birth: Sep 18, 1946 ? ?Transition of Care (TOC) CM/SW Contact  ?Shelbie Hutching, RN ?Phone Number: ?01/07/2022, 10:58 AM ? ?Clinical Narrative:    ?MD reports that patient should be ready for discharge in the next couple of days.  Son accepted bed offer for [facility].  Will need to start insurance auth over the weekend for Monday discharge.   ? ? ?Expected Discharge Plan: [place] ?Barriers to Discharge: Continued Medical Work up ? ?Expected Discharge Plan and Services ?Expected Discharge Plan: [place] ?  ?Discharge Planning Services: CM Consult ?Post Acute Care Choice: [place] ?Living arrangements for the past 2 months: Apartment ?                ?DME Arranged: N/A ?DME Agency: NA ?  ?  ?  ?HH Arranged: NA ?[place] Agency: NA ?  ?  ?  ? ? ?Social Determinants of Health (SDOH) Interventions ?  ? ?Readmission Risk Interventions ?   ? View : No data to display.  ?  ?  ?  ? ? ?

## 2022-01-07 NOTE — Progress Notes (Signed)
?Progress Note ? ? ?PatientMarland Kitchen Mike Parks:[id number] DOB: 1946/06/05 DOA: 12/31/2021     7 ?DOS: the patient was seen and examined on 01/07/2022 ?  ?Brief hospital course: ?Taken from prior notes. ? ?76 yo M with past medical history of multiple sclerosis, Barrett esophagus, dysphagia s/p PEG tube placement 11/2020, T2DM, COPD, hypothyroid, GERD, overactive bladder, as reviewed from EMR, presented at [facility] ED from home with complaints of abdominal pain in the setting of a suspected clogged PEG tube and coffee ground emesis since Thurs night 12/30/21. Patient had come to the ED on 12/29/21 because his PEG tube had become dislodged. Per chart review it appears as though the PEG tube was replaced bedside, no confirmatory imaging noted. Per Dr. Adelene Idler documentation & via telephone discussion with the patient's son, abdominal pain that began the evening of 12/29/21. The patient's son reported this abdominal pain worsened on 12/30/21 and then the patient began having coffee-ground emesis on the morning of 12/31/21. Per son, the patient has been able to take all his medications as prescribed. ?  ?ED course: ?Upon arrival the patient was hypotensive & tachycardic, spitting up foamy phlegm with coffee-ground material. CT imaging shows malposition of the PEG tube in the peritoneal cavity, not inside the intestine/stomach.  Pneumoperitoneum was also noted.  Surgery was contacted for admission and emergent intervention. ? ?Patient was taken to the OR for exploratory laparotomy on 12/31/2021.  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  Patient was taken to ICU postoperatively due to peritonitis and possible for perforation. ? ?He is currently weaned off from pressors. ? ?Worsening respiratory status, currently on heated high flow at 45 L of oxygen. ? ?He was also placed on TPN until bowel functions resume. ?He is  currently on Zosyn and Diflucan. ?Palliative care was also consulted to discuss goals of care. ?Very high risk for deterioration and death. ? ?2023/01/22; patient was started on low-dose Levophed due to persistent hypotension not responding to IV fluid.  Currently blood pressure stable.  Clinically seems the same.  General surgery is going to do GI series tomorrow via NG tube.  Palliative care was also consulted and a family meeting is planned for tomorrow around noon.  We will continue current level of care at this time. ? ?3/22: Patient had upper GI series which was negative for any leak, surgery is planning to start trickle feed through G-tube. ?Palliative care meeting with family, patient is not DNR with full scope of medical care.  If he deteriorates then they are open to discuss about comfort care but would like to continue current level of care at this time. ?Remains very lethargic but off the pressors now, able to wean off to 2 L of oxygen. ? ?3/23: Clinically seems improving.  Still feeling weak.  Able to wean off to room air now. ?There was some trouble getting TPN from [facility], currently on trickle enteric feed and will reach his goal feeding by tomorrow.  Also having some mild diarrhea. ? ?3/24: Nursing concern of excessive upper respiratory thick secretions, requiring frequent suctioning.  Does not like a vibrating bed for chest PT. [place] is seen added. ?Surgery to progress to goal rate of tube feed today. ?We will continue holding TPN at this time. ?PT/OT evaluation for a potential discharge in next couple of days. ? ?Later informed by nursing staff that patient developed a transient episode of atrial fibrillation,  EKG was obtained which is consistent with rate controlled atrial fibrillation.  Patient reverted back to sinus rhythm spontaneously after a very short time.  We will monitor at this time ? ? ? ?Assessment and Plan: ?* Pneumoperitoneum ?Secondary to dislodged intraperitoneal gastrostomy tube  s/p exploratory laparotomy, repair, insertion of new gastrostomy tube and takedown of gastrocolonic fistula. ?GI series with no leak- ?Patient was started on enteric feeding with G-tube and rate will be progress to goal today. ?-Keep holding TPN ?-Continue with Zosyn and Diflucan-being managed by surgery, they wanted for 7 days. ?-Continue with PPI IV twice daily. ?-Continue with supportive care ? ? ? ?Paroxysmal atrial fibrillation (HCC) ?Patient developed a very transient episode of atrial fibrillation.  No prior history.  Remained rate controlled and no hemodynamic compromise. ?Spontaneously reverted back to sinus rhythm. ?-Continue to monitor. ?-No need for any anticoagulation at this time as patient is high risk for bleeding due to recent perforated ulcer and surgeries. ? ?Acute respiratory failure with hypoxia (HCC) ?Resolved-patient is now on room air ?No baseline oxygen use. ?-Continue with supplemental oxygen- wean further as tolerated. ?-Continue with supportive care ? ?Hypotension ?Blood pressure within goal now and we were able to wean him off from pressor. ?-Monitor blood pressure ? ?Malnutrition of moderate degree ?Estimated body mass index is 17.72 kg/m? as calculated from the following: ?  Height as of 12/29/21: 5\' 9"  (1.753 m). ?  Weight as of 12/29/21: 54.4 kg.  ? ?-Currently on TPN ? ?Hypothyroidism ?Home Synthroid was initially held as patient is currently n.p.o. ?-Start him on IV Synthroid ? ?Diarrhea ?Patient developed diarrhea requiring rectal seal after starting enteric feeding, might be related to formula.  No worsening abdominal pain or leukocytosis. ?-Message was sent to dietitian-they are trying to observe her for next 1 to 2 days before making any changes.  No need to add fiber at this time. ?-Continue to monitor ?  ? ?Subjective: Patient was seen and examined today.  He was getting chest PT with the vibrating bag and did not like it.  Still stating that he does not feel good but denies  any significant pain. ?Having some diarrhea, rectal seal in place. ? ?Physical Exam: ?Vitals:  ? 01/07/22 0600 01/07/22 0800 01/07/22 1202 01/07/22 1300  ?BP: 93/60 100/60 100/60 91/60  ?Pulse: 76 79 79 77  ?Resp: 15 (!) 26 (!) 26 20  ?Temp:  98.7 ?F (37.1 ?C) 100.1 ?F (37.8 ?C) 99.5 ?F (37.5 ?C)  ?TempSrc:  Oral Axillary Axillary  ?SpO2: 94% 96% 94% 94%  ?Weight:   62.2 kg   ?Height:   5\' 9"  (1.753 m)   ? ?General.  Chronically ill-appearing frail elderly man, in no acute distress. ?Pulmonary.  Lungs clear bilaterally, normal respiratory effort. ?CV.  Regular rate and rhythm, no JVD, rub or murmur. ?Abdomen.  Soft, nontender, nondistended, BS positive.  2 drains NG tube in place ?CNS.  Alert and oriented to self.  No focal neurologic deficit. ?Extremities.  No edema, no cyanosis, pulses intact and symmetrical. ?Psychiatry.  Judgment and insight appears impaired. ? ?Data Reviewed: ?Prior notes and labs reviewed. ? ?Family Communication: Discussed with son on phone. ? ?Disposition: ?Status is: Inpatient ?Remains inpatient appropriate because: Severity of illness ? ? Planned Discharge Destination: Skilled nursing facility ? ?DVT prophylaxis.  Lovenox ? ?Time spent: 46 minutes ? ?This record has been created using Conservation officer, historic buildings. Errors have been sought and corrected,but may not always be located. Such creation errors do not  reflect on the standard of care. ? ?Author: ?Arnetha Courser, MD ?01/07/2022 2:31 PM ? ?For on call review [email or web address]  ?

## 2022-01-07 NOTE — Progress Notes (Addendum)
Nutrition Follow-up ? ?DOCUMENTATION CODES:  ? ?Non-severe (moderate) malnutrition in context of chronic illness ? ?INTERVENTION:  ? ?Continue Osmolite 1.5 @ 55 ml/hr via g-tube ? ?45 ml Prosource TF BID.   ? ?140 ml free water flush every 4 hours ? ?Tube feeding regimen provides 2060 kcal (100% of needs), 105 grams of protein, and 1006 ml of H2O. Total free water: 1846 ml daily ? ?-1 packet Juven BID via tube, each packet provides 95 calories, 2.5 grams of protein (collagen), and 9.8 grams of carbohydrate (3 grams sugar); also contains 7 grams of L-arginine and L-glutamine, 300 mg vitamin C, 15 mg vitamin E, 1.2 mcg vitamin B-12, 9.5 mg zinc, 200 mg calcium, and 1.5 g  Calcium Beta-hydroxy-Beta-methylbutyrate to support wound healing  ? ?NUTRITION DIAGNOSIS:  ? ?Moderate Malnutrition related to chronic illness (MS, COPD, dysphagia) as evidenced by mild fat depletion, moderate fat depletion, moderate muscle depletion, severe muscle depletion. ? ?Ongoing ? ?GOAL:  ? ?Patient will meet greater than or equal to 90% of their needs ? ?Met with TF ? ?MONITOR:  ? ?Labs, Weight trends, TF tolerance, Skin, I & O's ? ?REASON FOR ASSESSMENT:  ? ?Consult ?New TPN/TNA ? ?ASSESSMENT:  ? ?76 y/o male with h/o MS, Barrett's esophagus, dysphagia with chronic G-tube (since 11/2020), COPD, DM, GERD and hypothyroidism who is admitted with PNA, septic shock and peritonitis secondary to dislodged G-tube s/p exploratory laparotomy 3/18 (with takedown of gastrocolonic fistula with primary stapled repair of gastrostomy site and colotomy site, placement of new open feeding gastrostomy tube) ? ?Reviewed I/O's: +628 ml x 24 hours and +7.7 L since admission ? ?UOP: 1.1 L x 24 hours ? ?Drain output: 195 ml x 24 hours ? ?Rectal tube output: 325 ml x 24 hours ? ?Case discussed with RN, MD, and during ICU rounds.  ? ?TPN d/c yesterday. Pt now on TF at goal rate and tolerating well.  ? ?Per RN, pt is agitated today.  ? ?Per MD, plan to transition ton  SDU today.   ? ?Labs reviewed: CBGS: 114-139.   ? ?Diet Order:   ?Diet Order   ? ?       ?  Diet NPO time specified  Diet effective now       ?  ? ?  ?  ? ?  ? ? ?EDUCATION NEEDS:  ? ?Not appropriate for education at this time ? ?Skin:  Skin Assessment: Reviewed RN Assessment (incision abdomen,  0.5X.5cm dark purple area to the buttock) ?Skin Integrity Issues:: Incisions ?Incisions: 3/18 abdomen ? ?Last BM:  01/07/22 (via rectal tube) ? ?Height:  ? ?Ht Readings from Last 1 Encounters:  ?01/07/22 5' 9" (1.753 m)  ? ? ?Weight:  ? ?Wt Readings from Last 1 Encounters:  ?01/07/22 62.2 kg  ? ?BMI:  Body mass index is 20.25 kg/m?. ? ?Estimated Nutritional Needs:  ? ?Kcal:  1800-2000 ? ?Protein:  90-105g ? ?Fluid:  > 1.8 L ? ? ? ? W, RD, LDN, CDCES ?Registered Dietitian II ?Certified Diabetes Care and Education Specialist ?Please refer to [facility] for RD and/or RD on-call/weekend/after hours pager  ?

## 2022-01-07 NOTE — Assessment & Plan Note (Signed)
Blood pressure within goal now and we were able to wean him off from pressor. ?-Monitor blood pressure ?

## 2022-01-07 NOTE — Progress Notes (Incomplete)
Pt falls asleep for a little while and then wakes up yelling, "Help me! I'm dying!" I suction his mouth every time he coughs something up but patient is clearly uncomfortable and  ?

## 2022-01-08 ENCOUNTER — Inpatient Hospital Stay: Payer: Medicare PPO

## 2022-01-08 DIAGNOSIS — J9601 Acute respiratory failure with hypoxia: Secondary | ICD-10-CM | POA: Diagnosis not present

## 2022-01-08 DIAGNOSIS — K659 Peritonitis, unspecified: Secondary | ICD-10-CM | POA: Diagnosis not present

## 2022-01-08 DIAGNOSIS — K668 Other specified disorders of peritoneum: Secondary | ICD-10-CM | POA: Diagnosis not present

## 2022-01-08 DIAGNOSIS — E440 Moderate protein-calorie malnutrition: Secondary | ICD-10-CM | POA: Diagnosis not present

## 2022-01-08 LAB — BASIC METABOLIC PANEL
Anion gap: 7 (ref 5–15)
BUN: 37 mg/dL — ABNORMAL HIGH (ref 8–23)
CO2: 24 mmol/L (ref 22–32)
Calcium: 7.5 mg/dL — ABNORMAL LOW (ref 8.9–10.3)
Chloride: 110 mmol/L (ref 98–111)
Creatinine, Ser: 1.1 mg/dL (ref 0.61–1.24)
GFR, Estimated: >60 mL/min (ref >60)
Glucose, Bld: 168 mg/dL — ABNORMAL HIGH (ref 70–99)
Potassium: 4.1 mmol/L (ref 3.5–5.1)
Sodium: 141 mmol/L (ref 135–145)

## 2022-01-08 LAB — GLUCOSE, CAPILLARY
Glucose-Capillary: 107 mg/dL — ABNORMAL HIGH (ref 70–99)
Glucose-Capillary: 142 mg/dL — ABNORMAL HIGH (ref 70–99)
Glucose-Capillary: 163 mg/dL — ABNORMAL HIGH (ref 70–99)
Glucose-Capillary: 177 mg/dL — ABNORMAL HIGH (ref 70–99)
Glucose-Capillary: 178 mg/dL — ABNORMAL HIGH (ref 70–99)

## 2022-01-08 LAB — URINALYSIS, COMPLETE (UACMP) WITH MICROSCOPIC
Bacteria, UA: NONE SEEN
Bilirubin Urine: NEGATIVE
Glucose, UA: NEGATIVE mg/dL
Ketones, ur: NEGATIVE mg/dL
Leukocytes,Ua: NEGATIVE
Nitrite: NEGATIVE
Protein, ur: 100 mg/dL — AB
Specific Gravity, Urine: 1.018 (ref 1.005–1.030)
pH: 6 (ref 5.0–8.0)

## 2022-01-08 LAB — EXPECTORATED SPUTUM ASSESSMENT W GRAM STAIN, RFLX TO RESP C

## 2022-01-08 LAB — CBC
HCT: 29.2 % — ABNORMAL LOW (ref 39.0–52.0)
Hemoglobin: 9.3 g/dL — ABNORMAL LOW (ref 13.0–17.0)
MCH: 29.7 pg (ref 26.0–34.0)
MCHC: 31.8 g/dL (ref 30.0–36.0)
MCV: 93.3 fL (ref 80.0–100.0)
Platelets: 288 10*3/uL (ref 150–400)
RBC: 3.13 MIL/uL — ABNORMAL LOW (ref 4.22–5.81)
RDW: 14.7 % (ref 11.5–15.5)
WBC: 14.1 10*3/uL — ABNORMAL HIGH (ref 4.0–10.5)
nRBC: 0 % (ref 0.0–0.2)

## 2022-01-08 LAB — PROCALCITONIN: Procalcitonin: 0.26 ng/mL

## 2022-01-08 MED ORDER — IOHEXOL 9 MG/ML PO SOLN
500.0000 mL | ORAL | Status: AC
  Administered 2022-01-08 (×2): 500 mL via ORAL

## 2022-01-08 MED ORDER — IBUPROFEN 100 MG/5ML PO SUSP
400.0000 mg | Freq: Once | ORAL | Status: DC | PRN
  Filled 2022-01-08: qty 20

## 2022-01-08 NOTE — Progress Notes (Signed)
?Progress Note ? ? ?PatientMarland Kitchen Mike Parks BEM:[id number] DOB: [birth year] DOA: 12/31/2021     8 ?DOS: the patient was seen and examined on 01/08/2022 ?  ?Brief hospital course: ?Taken from prior notes. ? ?76 yo M with past medical history of multiple sclerosis, Barrett esophagus, dysphagia s/p PEG tube placement 11/2020, T2DM, COPD, hypothyroid, GERD, overactive bladder, as reviewed from EMR, presented at [facility] ED from home with complaints of abdominal pain in the setting of a suspected clogged PEG tube and coffee ground emesis since Thurs night 12/30/21. Patient had come to the ED on 12/29/21 because his PEG tube had become dislodged. Per chart review it appears as though the PEG tube was replaced bedside, no confirmatory imaging noted. Per Dr. Adelene Idler documentation & via telephone discussion with the patient's son, abdominal pain that began the evening of 12/29/21. The patient's son reported this abdominal pain worsened on 12/30/21 and then the patient began having coffee-ground emesis on the morning of 12/31/21. Per son, the patient has been able to take all his medications as prescribed. ?  ?ED course: ?Upon arrival the patient was hypotensive & tachycardic, spitting up foamy phlegm with coffee-ground material. CT imaging shows malposition of the PEG tube in the peritoneal cavity, not inside the intestine/stomach.  Pneumoperitoneum was also noted.  Surgery was contacted for admission and emergent intervention. ? ?Patient was taken to the OR for exploratory laparotomy on 12/31/2021.  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  Patient was taken to ICU postoperatively due to peritonitis and possible for perforation. ? ?He is currently weaned off from pressors. ? ?Worsening respiratory status, currently on heated high flow at 45 L of oxygen. ? ?He was also placed on TPN until bowel functions resume. ?He is  currently on Zosyn and Diflucan. ?Palliative care was also consulted to discuss goals of care. ?Very high risk for deterioration and death. ? ?Jan 07, 2023; patient was started on low-dose Levophed due to persistent hypotension not responding to IV fluid.  Currently blood pressure stable.  Clinically seems the same.  General surgery is going to do GI series tomorrow via NG tube.  Palliative care was also consulted and a family meeting is planned for tomorrow around noon.  We will continue current level of care at this time. ? ?3/22: Patient had upper GI series which was negative for any leak, surgery is planning to start trickle feed through G-tube. ?Palliative care meeting with family, patient is not DNR with full scope of medical care.  If he deteriorates then they are open to discuss about comfort care but would like to continue current level of care at this time. ?Remains very lethargic but off the pressors now, able to wean off to 2 L of oxygen. ? ?3/23: Clinically seems improving.  Still feeling weak.  Able to wean off to room air now. ?There was some trouble getting TPN from [facility], currently on trickle enteric feed and will reach his goal feeding by tomorrow.  Also having some mild diarrhea. ? ?3/24: Nursing concern of excessive upper respiratory thick secretions, requiring frequent suctioning.  Does not like a vibrating bed for chest PT. [place] is seen added. ?Surgery to progress to goal rate of tube feed today. ?We will continue holding TPN at this time. ?PT/OT evaluation for a potential discharge in next couple of days. ? ?Later informed by nursing staff that patient developed a transient episode of atrial fibrillation,  EKG was obtained which is consistent with rate controlled atrial fibrillation.  Patient reverted back to sinus rhythm spontaneously after a very short time.  We will monitor at this time. ? ?3/25: Patient did develop another episode of hypotension yesterday evening requiring 1 L of bolus with  improvement in blood pressure.  Worsening leukocytosis and low-grade fever this morning.  Patient continued to scream help whenever awake and resting in between. ?Repeat CT with some concern of worsening bilateral pleural effusion and new groundglass opacities in the upper lobe, broad differential which include aspiration pneumonia, patient is already on antibiotics.  CT abdomen improving and complete resolution of prior pneumoperitoneum. ?Checking procalcitonin. ? ?Patient developed another febrile episode with temperature of 101.6.  Checking blood and urine cultures, checking respiratory cultures.  Discussed with son to revisit the goals of care, he will discuss with his dad and come back to [place]. ? ?Patient is very high risk for deterioration.  Will request palliative care to revisit as family might have changed their perspective. ? ? ?Assessment and Plan: ?* Pneumoperitoneum ?Secondary to dislodged intraperitoneal gastrostomy tube s/p exploratory laparotomy, repair, insertion of new gastrostomy tube and takedown of gastrocolonic fistula. ?GI series with no leak- ?Patient was started on enteric feeding with G-tube and rate will be progress to goal today. ?-Keep holding TPN ?-Continue with Zosyn and Diflucan-being managed by surgery, they wanted for 7 days. ?-Continue with PPI IV twice daily. ?-Continue with supportive care ? ? ? ?Paroxysmal atrial fibrillation (HCC) ?Patient developed a very transient episode of atrial fibrillation.  No prior history.  Remained rate controlled and no hemodynamic compromise. ?Spontaneously reverted back to sinus rhythm. ?-Continue to monitor. ?-No need for any anticoagulation at this time as patient is high risk for bleeding due to recent perforated ulcer and surgeries. ? ?Acute respiratory failure with hypoxia ([place]) ?Resolved-patient is now on room air ?No baseline oxygen use. ?Repeat CT chest today with some new groundglass opacities with broad differential which also include  aspiration pneumonia. ?Patient is already on Zosyn. ?-Check procalcitonin ?-Continue with supportive care ? ?Hypotension ?Patient had another episode of becoming hypotensive yesterday evening requiring 1 L of bolus. ?Blood pressure within goal now. ?-Monitor blood pressure ? ?Malnutrition of moderate degree ?Estimated body mass index is 17.72 kg/m? as calculated from the following: ?  Height as of 12/29/21: 5\' 9"  (1.753 m). ?  Weight as of 12/29/21: 54.4 kg.  ? ?-Currently on TPN ? ?Hypothyroidism ?Home Synthroid was initially held as patient is currently n.p.o. ?-Start him on IV Synthroid ? ?Diarrhea ?Patient developed diarrhea requiring rectal seal after starting enteric feeding, might be related to formula.  No worsening abdominal pain or leukocytosis. ?-Message was sent to dietitian-they are trying to observe her for next 1 to 2 days before making any changes.  No need to add fiber at this time. ?-Continue to monitor ?  ? ?Subjective: Patient was sleeping comfortably when seen today.  On awakening he started shouting help help, I am dying.  And then he went back to sleep. ?Patient appears very lethargic.  When asked about pain he nodded no. ? ?Physical Exam: ?Vitals:  ? 01/08/22 1200 01/08/22 1300 01/08/22 1400 01/08/22 1455  ?BP: (!) 90/57  112/61 (!) 95/58  ?Pulse: 81 85 86 88  ?Resp: (!) 23 18 (!) 25 19  ?Temp: 98.4 ?F (36.9 ?C)   (!) 101.6 ?F (38.7 ?C)  ?TempSrc: Axillary   Axillary  ?SpO2: 97% 98% 96% 95%  ?Weight:      ?  Height:      ? ?General.  Chronically ill-appearing elderly man, in no acute distress. ?Pulmonary.  Lungs clear bilaterally, normal respiratory effort. ?CV.  Regular rate and rhythm, no JVD, rub or murmur. ?Abdomen.  Soft, nontender, nondistended, BS positive. ?CNS.  Lethargic, no apparent focal deficit. ?Extremities.  No edema, no cyanosis, pulses intact and symmetrical. ?Psychiatry.  Judgment and insight appears impaired. ? ?Data Reviewed: ?Prior notes and labs reviewed ? ?Family  Communication: Discussed with son on phone ? ?Disposition: ?Status is: Inpatient ?Remains inpatient appropriate because: Severity of illness ? ? Planned Discharge Destination: Skilled nursing facility ? ?DVT prophylaxis.

## 2022-01-08 NOTE — Assessment & Plan Note (Signed)
Patient had another episode of becoming hypotensive yesterday evening requiring 1 L of bolus. ?Blood pressure within goal now. ?-Monitor blood pressure ?

## 2022-01-08 NOTE — Assessment & Plan Note (Signed)
Resolved-patient is now on room air ?No baseline oxygen use. ?Repeat CT chest today with some new groundglass opacities with broad differential which also include aspiration pneumonia. ?Patient is already on Zosyn. ?-Check procalcitonin ?-Continue with supportive care ?

## 2022-01-08 NOTE — Progress Notes (Signed)
[facility] ?SURGICAL PROGRESS NOTE ? ?Hospital Day(s): 8.  ? ?Post op day(s): 8 Days Post-Op.  ? ?Interval History:  ?Patient seen and examined ?No acute events or new complaints overnight.  ?LLQ drain; serous ?RLQ drain; serous  ?He is having bowel function ?TPN discontinued ? ?Vital signs in last 24 hours: [min-max] current  ?Temp:  [98.3 ?F (36.8 ?C)-100.2 ?F (37.9 ?C)] 100.2 ?F (37.9 ?C) (03/25 [id number]) ?Pulse Rate:  [40-90] 88 (03/25 0728) ?Resp:  [20-26] 24 (03/25 0728) ?BP: (87-106)/(43-60) 106/60 (03/25 0728) ?SpO2:  [94 %-100 %] 97 % (03/25 0728) ?Weight:  [62.2 kg] 62.2 kg (03/24 1202)     Height: 5\' 9"  (175.3 cm) Weight: 62.2 kg BMI (Calculated): 20.24  ? ?Intake/Output last 2 shifts:  ?03/24 0701 - 03/25 0700 ?In: 213.8 [I.V.:5.5; IV Piggyback:108.3] ?Out: [id number] [Urine:1300; Drains:315; Stool:100]  ? ?Physical Exam:  ?Constitutional: Alert, cooperative ?Respiratory: audible upper air way sounds;  ?Cardiovascular: regular rate and sinus rhythm  ?Gastrointestinal: Soft, abdominal soreness, non-distended, surgical drains in RLQ/LLQ both with serous output, gastrostomy tube in place; site CDI ?Incision with staples intact.  ? ?Labs:  ? ?  Latest Ref Rng & Units 01/08/2022  ?  4:37 AM 01/06/2022  ?  4:47 AM 01/03/2022  ?  5:17 AM  ?CBC  ?WBC 4.0 - 10.5 K/uL 14.1   9.2   10.6    ?Hemoglobin 13.0 - 17.0 g/dL 9.3   9.0   9.3    ?Hematocrit 39.0 - 52.0 % 29.2   27.7   28.3    ?Platelets 150 - 400 K/uL 288   178   156    ? ? ?  Latest Ref Rng & Units 01/08/2022  ?  4:37 AM 01/07/2022  ?  6:33 AM 01/06/2022  ?  4:47 AM  ?CMP  ?Glucose 70 - 99 mg/dL 168   136   159    ?BUN 8 - 23 mg/dL 37   24   27    ?Creatinine 0.61 - 1.24 mg/dL 1.10   0.84   0.72    ?Sodium 135 - 145 mmol/L 141   140   139    ?Potassium 3.5 - 5.1 mmol/L 4.1   3.9   3.7    ?Chloride 98 - 111 mmol/L 110   109   107    ?CO2 22 - 32 mmol/L 24   24   27     ?Calcium 8.9 - 10.3 mg/dL 7.5   7.2   7.7    ?Total Protein 6.5 - 8.1 g/dL   5.0     ?Total Bilirubin 0.3 - 1.2 mg/dL   0.3    ?Alkaline Phos 38 - 126 U/L   48    ?AST 15 - 41 U/L   51    ?ALT 0 - 44 U/L   55    ? ? ? ?Imaging studies: CT CAPordered, and contrast currently being given.  ? ? ?Assessment/Plan: ?76 y.o. male 8 Days Post-Op s/p exploratory laparotomy, takedown of gastrocolonic fistula with primary stapled repair of gastrostomy site and colotomy site, placement of new open feeding gastrostomy tube ? ? - low grade temp, with WBC elevation, CT evaluation  pending.  ? - Monitor abdominal examination ?- Continue surgical drains; serous; monitor and record output ? - Pain control prn  ? - Further management per primary service ? ? ? ?Ronny Bacon, M.D., FACS ?[facility] ? ?01/08/2022 ; 12:19 PM ? ? ?

## 2022-01-08 NOTE — Progress Notes (Signed)
Son Onalee Hua) and daughter-in-law at bedside. Son stated that if patient declines any further or unable to manage pain then to call him and he would like father to transition to comfort care. I answered their questions, explained patient is febrile with low BP and showing signs of sepsis. Family stated that they understand. Son stated "I don't want anything inhumane and don't want him to suffer." He stated that he would like to see how the night went and then in the morning make the call to transition to comfort care if no improvement. I informed Dr. Nelson Chimes of conversation and will pass off to [facility] RN plan of care. Son's contact info verified in chart.  ?

## 2022-01-08 NOTE — Assessment & Plan Note (Signed)
Patient developed a very transient episode of atrial fibrillation.  No prior history.  Remained rate controlled and no hemodynamic compromise. ?Spontaneously reverted back to sinus rhythm. ?-Continue to monitor. ?-No need for any anticoagulation at this time as patient is high risk for bleeding due to recent perforated ulcer and surgeries. ?

## 2022-01-08 NOTE — Progress Notes (Signed)
Patient does not tolerate chest PT.  Patient was resting comfortably and while trying to do CPT patient begins yelling and continues to escalate while this writer tries to perform.  Patient currently on room air, saturations 97% with canula still in nares. RN aware and will remove canula if continues to tolerate. ?

## 2022-01-08 NOTE — Progress Notes (Signed)
?   01/08/22 1455  ?Assess: MEWS Score  ?Temp (!) 101.6 ?F (38.7 ?C)  ?BP (!) 95/58  ?Pulse Rate 88  ?ECG Heart Rate 84  ?Resp 19  ?SpO2 95 %  ?O2 Device Room Air  ?Assess: MEWS Score  ?MEWS Temp 2  ?MEWS Systolic 1  ?MEWS Pulse 0  ?MEWS RR 0  ?MEWS LOC 1  ?MEWS Score 4  ?MEWS Score Color Red  ?Assess: if the MEWS score is Yellow or Red  ?Were vital signs taken at a resting state? Yes  ?Focused Assessment No change from prior assessment  ?Does the patient meet 2 or more of the SIRS criteria? Yes  ?Does the patient have a confirmed or suspected source of infection? Yes  ?Provider and Rapid Response Notified? Yes  ?MEWS guidelines implemented *See Row Information* No, previously yellow, continue vital signs every 4 hours ?(Sepsis protocol followed last evening for red mews. No new fluids or orders per MD)  ?Treat  ?MEWS Interventions Administered prn meds/treatments;Escalated (See documentation below) ?(Dr. Nelson Chimes and charge RN notified)  ?Pain Score 0  ?Breathing 0  ?Negative Vocalization 0  ?Facial Expression 0  ?Body Language 0  ?Consolability 0  ?PAINAD Score 0  ?Escalate  ?MEWS: Escalate Red: discuss with charge nurse/RN and provider, consider discussing with RRT  ?Notify: Charge Nurse/RN  ?Name of Charge Nurse/RN Notified Judeth Cornfield RN  ?Date Charge Nurse/RN Notified 01/08/22  ?Time Charge Nurse/RN Notified 1503  ?Notify: Provider  ?Provider Name/Title Dr. Nelson Chimes  ?Date Provider Notified 01/08/22  ?Time Provider Notified [phone]  ?Notification Type Page  ?Notification Reason Change in status ?(RED MUSE/TEMP 101.6)  ?Provider response See new orders ?(Dr. Nelson Chimes placed new orders.)  ?Date of Provider Response 01/08/22  ?Time of Provider Response 1504  ?Assess: SIRS CRITERIA  ?SIRS Temperature  1  ?SIRS Pulse 0  ?SIRS Respirations  0  ?SIRS WBC 1  ?SIRS Score Sum  2  ? ? ?

## 2022-01-08 NOTE — Evaluation (Signed)
Occupational Therapy Evaluation ?Patient Details ?Name: Mike Parks ?MRN: [id number] ?DOB: 15-Nov-1945 ?Today's Date: 01/08/2022 ? ? ?History of Present Illness Pt is a 76 yo M with past medical history of multiple sclerosis, Barrett esophagus, dysphagia s/p PEG tube placement 11/2020, DM, COPD, hypothyroid, GERD, and overactive bladder who presented at [facility] ED from home with complaints of abdominal pain.  Pt diagnosed with free air and free fluid due to malposition of feeding tube placed in the peritoneal cavity but outside of the intestine or stomach. Pt now s/p exploratory laparotomy with takedown of gastrocolic fistula and placement of new gastrostomy feeding tube.  ? ?Clinical Impression ?  ?Chart reviewed, RN present throughout OT evaluation. Pt unable to provide PLOF or home set up and speech garbled on this date. Per chart review it appears pt was more independent in ADL/mobility than he is presenting on this date. TOTAL A +2 required for rolling and supine<>sit. Poor static sitting balance requiring MOD A +1 to maintain for approx 3 minutes. Total-MAX A anticipated for all ADL on this date.  At this time recommend STR to address functional deficits. Pt is left in care of RN, all needs met. OT will continue to follow.  ?   ? ?Recommendations for follow up therapy are one component of a multi-disciplinary discharge planning process, led by the attending physician.  Recommendations may be updated based on patient status, additional functional criteria and insurance authorization.  ? ?Follow Up Recommendations ? Skilled nursing-short term rehab (<3 hours/day)  ?  ?Assistance Recommended at Discharge Frequent or constant Supervision/Assistance  ?Patient can return home with the following Two people to help with walking and/or transfers;Two people to help with bathing/dressing/bathroom ? ?  ?Functional Status Assessment ? Patient has had a recent decline in their functional status and demonstrates the ability  to make significant improvements in function in a reasonable and predictable amount of time.  ?Equipment Recommendations ? Other (comment) (per next venue of care)  ?  ?Recommendations for Other Services   ? ? ?  ?Precautions / Restrictions Precautions ?Precautions: Fall ?Restrictions ?Weight Bearing Restrictions: No  ? ?  ? ?Mobility Bed Mobility ?  ?  ?  ?  ?  ?  ?  ?  ?  ? ?Transfers ?  ?  ?  ?  ?  ?  ?  ?  ?  ?  ?  ? ?  ?Balance   ?  ?  ?  ?  ?  ?  ?  ?  ?  ?  ?  ?  ?  ?  ?  ?  ?  ?  ?   ? ?ADL either performed or assessed with clinical judgement  ? ?ADL Overall ADL's : Needs assistance/impaired ?  ?  ?  ?  ?  ?  ?  ?  ?  ?  ?  ?  ?  ?  ?  ?  ?  ?  ?  ?General ADL Comments: MAX-TOTAL A for all ADL tasks on this date; rolling with TOTAL A +2, supine<>sit with TOTAL A +2, poor static sitting balance requiring MOD A +1 to sustain seated at EOB for approx 3 minutes  ? ? ? ?Vision   ?Additional Comments: will continue to assess, pt is unable to report  ?   ?Perception   ?  ?Praxis   ?  ? ?Pertinent Vitals/Pain Pain Assessment ?Pain Assessment: Faces ?Faces Pain Scale: Hurts even more ?Pain Location: generalized ?  Pain Descriptors / Indicators: Grimacing ?Pain Intervention(s): Limited activity within patient's tolerance, Monitored during session, Repositioned, RN gave pain meds during session  ? ? ? ?Hand Dominance   ?  ?Extremity/Trunk Assessment Upper Extremity Assessment ?Upper Extremity Assessment: Generalized weakness ?  ?Lower Extremity Assessment ?Lower Extremity Assessment: Generalized weakness ?  ?  ?  ?Communication Communication ?Communication: Expressive difficulties ?  ?Cognition Arousal/Alertness: Lethargic ?Behavior During Therapy: Flat affect ?Overall Cognitive Status: Difficult to assess ?  ?  ?  ?  ?  ?  ?  ?  ?  ?  ?  ?  ?  ?  ?  ?  ?General Comments: Unable to follow 1 step directives on this date; ?  ?  ?General Comments  rectal tube, 2 jp drains, gtube ? ?  ?Exercises Other Exercises ?Other  Exercises: edu: re role of OT, role of rehab, importance of oob mobility ?  ?Shoulder Instructions    ? ? ?Home Living Family/patient expects to be discharged to:: Unsure ?  ?  ?  ?  ?  ?  ?  ?  ?  ?  ?  ?  ?  ?  ?  ?  ?Additional Comments: pt with garbled speech, unable to obtain history or PLOF due to cognitive status. ?  ? ?  ?Prior Functioning/Environment   ?  ?  ?  ?  ?  ?  ?  ?  ?  ? ?  ?  ?OT Problem List: Decreased strength;Decreased activity tolerance ?  ?   ?OT Treatment/Interventions: Self-care/ADL training;Therapeutic exercise;Patient/family education;Balance training;DME and/or AE instruction;Therapeutic activities;Cognitive remediation/compensation  ?  ?OT Goals(Current goals can be found in the care plan section) Acute Rehab OT Goals ?OT Goal Formulation: Patient unable to participate in goal setting ?ADL Goals ?Pt Will Perform Grooming: sitting;with mod assist ?Pt Will Perform Upper Body Dressing: with mod assist;sitting ?Pt Will Transfer to Toilet: with mod assist;with +2 assist;stand pivot transfer;bedside commode  ?OT Frequency: Min 2X/week ?  ? ?Co-evaluation   ?  ?  ?  ?  ? ?  ?AM-PAC OT "6 Clicks" Daily Activity     ?Outcome Measure Help from another person eating meals?: Total ?Help from another person taking care of personal grooming?: Total ?Help from another person toileting, which includes using toliet, bedpan, or urinal?: Total ?Help from another person bathing (including washing, rinsing, drying)?: Total ?Help from another person to put on and taking off regular upper body clothing?: Total ?Help from another person to put on and taking off regular lower body clothing?: Total ?6 Click Score: 6 ?  ?End of Session Nurse Communication: Mobility status ? ?Activity Tolerance: Patient limited by lethargy ?Patient left: in bed;with call bell/phone within reach;with bed alarm set;with nursing/sitter in room ? ?OT Visit Diagnosis: Muscle weakness (generalized) (M62.81)  ?              ?Time:  0947-0962 ?OT Time Calculation (min): 13 min ?Charges:  OT General Charges ?$OT Visit: 1 Visit ?OT Evaluation ?$OT Eval Moderate Complexity: 1 Mod ? ?Shanon Payor, OTD OTR/L  ?01/08/22, 3:37 PM  ?

## 2022-01-09 DIAGNOSIS — J9601 Acute respiratory failure with hypoxia: Secondary | ICD-10-CM | POA: Diagnosis not present

## 2022-01-09 DIAGNOSIS — K659 Peritonitis, unspecified: Secondary | ICD-10-CM | POA: Diagnosis not present

## 2022-01-09 DIAGNOSIS — E440 Moderate protein-calorie malnutrition: Secondary | ICD-10-CM | POA: Diagnosis not present

## 2022-01-09 DIAGNOSIS — K668 Other specified disorders of peritoneum: Secondary | ICD-10-CM | POA: Diagnosis not present

## 2022-01-09 DIAGNOSIS — A419 Sepsis, unspecified organism: Secondary | ICD-10-CM

## 2022-01-09 LAB — CBC
HCT: 28.4 % — ABNORMAL LOW (ref 39.0–52.0)
Hemoglobin: 9.1 g/dL — ABNORMAL LOW (ref 13.0–17.0)
MCH: 29.7 pg (ref 26.0–34.0)
MCHC: 32 g/dL (ref 30.0–36.0)
MCV: 92.8 fL (ref 80.0–100.0)
Platelets: 358 10*3/uL (ref 150–400)
RBC: 3.06 MIL/uL — ABNORMAL LOW (ref 4.22–5.81)
RDW: 14.6 % (ref 11.5–15.5)
WBC: 17.1 10*3/uL — ABNORMAL HIGH (ref 4.0–10.5)
nRBC: 0 % (ref 0.0–0.2)

## 2022-01-09 LAB — PROCALCITONIN: Procalcitonin: 0.37 ng/mL

## 2022-01-09 LAB — GLUCOSE, CAPILLARY
Glucose-Capillary: 114 mg/dL — ABNORMAL HIGH (ref 70–99)
Glucose-Capillary: 184 mg/dL — ABNORMAL HIGH (ref 70–99)
Glucose-Capillary: 92 mg/dL (ref 70–99)

## 2022-01-09 LAB — MRSA NEXT GEN BY PCR, NASAL: MRSA by PCR Next Gen: DETECTED — AB

## 2022-01-09 MED ORDER — LOPERAMIDE HCL 2 MG PO CAPS: 2.0000 mg | ORAL_CAPSULE | ORAL | Status: DC | PRN

## 2022-01-09 MED ORDER — SCOPOLAMINE 1 MG/3DAYS TD PT72
1.0000 | MEDICATED_PATCH | TRANSDERMAL | Status: DC
  Administered 2022-01-09 – 2022-01-15 (×3): 1.5 mg via TRANSDERMAL
  Filled 2022-01-09 (×3): qty 1

## 2022-01-09 MED ORDER — SODIUM CHLORIDE 0.9 % IV SOLN
500.0000 mg | INTRAVENOUS | Status: DC
  Administered 2022-01-09: 500 mg via INTRAVENOUS
  Filled 2022-01-09 (×2): qty 5

## 2022-01-09 MED ORDER — LORAZEPAM 2 MG/ML IJ SOLN: 1.0000 mg | INTRAMUSCULAR | Status: DC | PRN

## 2022-01-09 MED ORDER — SODIUM CHLORIDE 0.9 % IV SOLN
3.0000 g | Freq: Four times a day (QID) | INTRAVENOUS | Status: DC
  Administered 2022-01-09 – 2022-01-10 (×3): 3 g via INTRAVENOUS
  Filled 2022-01-09 (×4): qty 8
  Filled 2022-01-09: qty 3

## 2022-01-09 MED ORDER — POLYVINYL ALCOHOL 1.4 % OP SOLN
1.0000 [drp] | Freq: Four times a day (QID) | OPHTHALMIC | Status: DC | PRN
  Filled 2022-01-09: qty 15

## 2022-01-09 MED ORDER — BIOTENE DRY MOUTH MT LIQD: 15.0000 mL | OROMUCOSAL | Status: DC | PRN

## 2022-01-09 MED ORDER — DIPHENHYDRAMINE HCL 50 MG/ML IJ SOLN: 12.5000 mg | INTRAMUSCULAR | Status: DC | PRN

## 2022-01-09 MED ORDER — GLYCOPYRROLATE 0.2 MG/ML IJ SOLN
0.2000 mg | INTRAMUSCULAR | Status: DC | PRN
  Administered 2022-01-11 – 2022-01-15 (×16): 0.2 mg via INTRAVENOUS
  Filled 2022-01-09 (×16): qty 1

## 2022-01-09 MED ORDER — LORAZEPAM 1 MG PO TABS
1.0000 mg | ORAL_TABLET | ORAL | Status: DC | PRN
  Administered 2022-01-09 – 2022-01-10 (×2): 1 mg via ORAL
  Filled 2022-01-09 (×2): qty 1

## 2022-01-09 MED ORDER — GLYCOPYRROLATE 1 MG PO TABS
1.0000 mg | ORAL_TABLET | ORAL | Status: DC | PRN
  Filled 2022-01-09: qty 1

## 2022-01-09 MED ORDER — ONDANSETRON HCL 4 MG/2ML IJ SOLN: 4.0000 mg | Freq: Four times a day (QID) | INTRAMUSCULAR | Status: DC | PRN

## 2022-01-09 MED ORDER — ACETAMINOPHEN 650 MG RE SUPP: 650.0000 mg | Freq: Four times a day (QID) | RECTAL | Status: DC | PRN

## 2022-01-09 MED ORDER — GLYCOPYRROLATE 0.2 MG/ML IJ SOLN
0.2000 mg | INTRAMUSCULAR | Status: DC | PRN
  Filled 2022-01-09 (×3): qty 1

## 2022-01-09 MED ORDER — OXYCODONE HCL 5 MG/5ML PO SOLN
5.0000 mg | Freq: Once | ORAL | Status: AC
  Administered 2022-01-09: 5 mg
  Filled 2022-01-09: qty 5

## 2022-01-09 MED ORDER — ACETAMINOPHEN 325 MG PO TABS: 650.0000 mg | ORAL_TABLET | Freq: Four times a day (QID) | ORAL | Status: DC | PRN

## 2022-01-09 MED ORDER — HYDROMORPHONE HCL 1 MG/ML IJ SOLN
0.5000 mg | INTRAMUSCULAR | Status: DC | PRN
  Administered 2022-01-09 – 2022-01-13 (×14): 0.5 mg via INTRAVENOUS
  Filled 2022-01-09 (×14): qty 1

## 2022-01-09 MED ORDER — ONDANSETRON 4 MG PO TBDP
4.0000 mg | ORAL_TABLET | Freq: Four times a day (QID) | ORAL | Status: DC | PRN
  Filled 2022-01-09: qty 1

## 2022-01-09 MED ORDER — VANCOMYCIN HCL 1500 MG/300ML IV SOLN
1500.0000 mg | Freq: Once | INTRAVENOUS | Status: AC
  Administered 2022-01-09: 1500 mg via INTRAVENOUS
  Filled 2022-01-09: qty 300

## 2022-01-09 MED ORDER — HALOPERIDOL LACTATE 2 MG/ML PO CONC
0.5000 mg | ORAL | Status: DC | PRN
  Filled 2022-01-09: qty 0.3

## 2022-01-09 MED ORDER — SODIUM CHLORIDE 0.9 % IV BOLUS
500.0000 mL | Freq: Once | INTRAVENOUS | Status: AC
  Administered 2022-01-09: 500 mL via INTRAVENOUS

## 2022-01-09 MED ORDER — HALOPERIDOL LACTATE 5 MG/ML IJ SOLN: 0.5000 mg | INTRAMUSCULAR | Status: DC | PRN

## 2022-01-09 MED ORDER — LORAZEPAM 2 MG/ML PO CONC
1.0000 mg | ORAL | Status: DC | PRN
  Filled 2022-01-09: qty 0.5

## 2022-01-09 MED ORDER — HALOPERIDOL 0.5 MG PO TABS
0.5000 mg | ORAL_TABLET | ORAL | Status: DC | PRN
  Filled 2022-01-09: qty 1

## 2022-01-09 NOTE — Assessment & Plan Note (Signed)
Secondary to dislodged intraperitoneal gastrostomy tube s/p exploratory laparotomy, repair, insertion of new gastrostomy tube and takedown of gastrocolonic fistula. ?GI series with no leak- ?Patient was started on enteric feeding with G-tube and TPN has been discontinued. ?Completed the course of Zosyn and Diflucan yesterday. ?-Continue with PPI IV twice daily. ?-Continue with supportive care ? ? ?

## 2022-01-09 NOTE — Assessment & Plan Note (Signed)
Patient continued to decline with worsening hypotension requiring frequent boluses. ?Waiting for family visit to decide about goals of care.  If they continue aggressive medical management then we will transfer him to ICU for pressors. ?-Monitor blood pressure ?

## 2022-01-09 NOTE — Assessment & Plan Note (Signed)
Resolved-patient is now on room air ?No baseline oxygen use. ?Repeat CT chest today with some new groundglass opacities with broad differential which also include aspiration pneumonia, or atypical infection. ?Patient completed a course of Zosyn yesterday. ?MRSA swab was checked and it came back positive.  Procalcitonin at 0.37 ?-Start him on Unasyn, Zithromax and vancomycin. ?-Continue with supportive care ?

## 2022-01-09 NOTE — Assessment & Plan Note (Signed)
Patient now met sepsis criteria with fever and leukocytosis.  He also had diarrhea since start off enteric feeding, initially thought to be due to tube feed and a change in formula was requested. ?Patient received Zosyn for 7 days. ?Repeat CT chest yesterday with new upper lobe groundglass opacities with a broad differential which include aspiration pneumonia or atypical infection. ?Procalcitonin at 0.36. ?CT abdomen with concern of some reflex, otherwise improvement from prior in terms of hemoperitoneum and no other obvious sign of infection. ?MRSA swab came back positive. ?Repeat blood cultures negative so far. ?Respiratory cultures and urine cultures are pending. ?Persistently declining and becoming hypotensive. ?-Starting him on Unasyn, Zithromax and vancomycin. ?-High risk for C. Difficile-message sent to ID. ?-Patient is very high risk for deterioration and death-family is leaning towards comfort care but has not make final decisions yet. ?-Continue to monitor ?

## 2022-01-09 NOTE — Consult Note (Addendum)
Pharmacy Antibiotic Note ? ?[name] is a 76 y.o. male admitted on 12/31/2021 with  aspiration pneumonia .  Patient received 7 days of diflucan and zosyn, but CT of chest done on 3/25 shows concern of new bilateral groundglass opacities with a broad differential which may include pneumonia vs atypical infection. Pharmacy has been consulted for Vancomycin and Unasyn dosing. ? ?Plan: ?Vancomycin loading dose of 1500mg  IV x 1 followed by Vancomycin 1000 mg IV Q 24 hrs. Goal AUC 400-550. ?Expected AUC: 481.2 ?Expected Css: 11.5 ?SCr used: 1.10 ? ?Unasyn 3g Q 6 hours ? ?Height: 5\' 9"  (175.3 cm) ?Weight: 62.4 kg (137 lb 9.1 oz) ?IBW/kg (Calculated) : 70.7 ? ?Temp (24hrs), Avg:100.1 ?F (37.8 ?C), Min:98.5 ?F (36.9 ?C), Max:102 ?F (38.9 ?C) ? ?Recent Labs  ?Lab 01/03/22 ?[id number] 01/04/22 ?[id number] 01/05/22 ?[id number] 01/06/22 ?[id number] 01/07/22 ?[id number] 01/08/22 ?[id number] 01/09/22 ?[id number]  ?WBC 10.6*  --   --  9.2  --  14.1* 17.1*  ?CREATININE 1.05 0.79 0.76 0.72 0.84 1.10  --   ?  ?Estimated Creatinine Clearance: 50.4 mL/min (by C-G formula based on SCr of 1.1 mg/dL).   ? ?No Known Allergies ? ?Antimicrobials this admission: ?Diflucan 3/18 >> 3/23 ?Zosyn 3/18 >> 3/24 ?Vancomycin 3/26>> ?Unasyn 3/26>> ?Azithromycin 3/26>> ? ?Dose adjustments this admission: ?N/A ? ?Microbiology results: ?3/17 BCx: NGF ?3/25 repeat Bcx: NGTD ?3/18 UCx: NGF  ?3/26 MRSA PCR: positive ? ?Thank you for allowing pharmacy to be a part of this patientMikes care. ? ?Mike Parks ?01/09/2022 12:44 PM ? ?

## 2022-01-09 NOTE — Progress Notes (Addendum)
?Progress Note ? ? ?PatientMarland Kitchen Mike Parks CNO:[id number] DOB: [birth year] DOA: 12/31/2021     9 ?DOS: the patient was seen and examined on 01/09/2022 ?  ?Brief hospital course: ?Taken from prior notes. ? ?76 yo M with past medical history of multiple sclerosis, Barrett esophagus, dysphagia s/p PEG tube placement 11/2020, T2DM, COPD, hypothyroid, GERD, overactive bladder, as reviewed from EMR, presented at [facility] ED from home with complaints of abdominal pain in the setting of a suspected clogged PEG tube and coffee ground emesis since Thurs night 12/30/21. Patient had come to the ED on 12/29/21 because his PEG tube had become dislodged. Per chart review it appears as though the PEG tube was replaced bedside, no confirmatory imaging noted. Per Dr. Mont Dutton documentation & via telephone discussion with the patient's son, abdominal pain that began the evening of 12/29/21. The patient's son reported this abdominal pain worsened on 12/30/21 and then the patient began having coffee-ground emesis on the morning of 12/31/21. Per son, the patient has been able to take all his medications as prescribed. ?  ?ED course: ?Upon arrival the patient was hypotensive & tachycardic, spitting up foamy phlegm with coffee-ground material. CT imaging shows malposition of the PEG tube in the peritoneal cavity, not inside the intestine/stomach.  Pneumoperitoneum was also noted.  Surgery was contacted for admission and emergent intervention. ? ?Patient was taken to the OR for exploratory laparotomy on 12/31/2021.  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  Patient was taken to ICU postoperatively due to peritonitis and possible for perforation. ? ?He is currently weaned off from pressors. ? ?Worsening respiratory status, currently on heated high flow at 45 L of oxygen. ? ?He was also placed on TPN until bowel functions resume. ?He is  currently on Zosyn and Diflucan. ?Palliative care was also consulted to discuss goals of care. ?Very high risk for deterioration and death. ? ?01/13/2023; patient was started on low-dose Levophed due to persistent hypotension not responding to IV fluid.  Currently blood pressure stable.  Clinically seems the same.  General surgery is going to do GI series tomorrow via NG tube.  Palliative care was also consulted and a family meeting is planned for tomorrow around noon.  We will continue current level of care at this time. ? ?3/22: Patient had upper GI series which was negative for any leak, surgery is planning to start trickle feed through G-tube. ?Palliative care meeting with family, patient is not DNR with full scope of medical care.  If he deteriorates then they are open to discuss about comfort care but would like to continue current level of care at this time. ?Remains very lethargic but off the pressors now, able to wean off to 2 L of oxygen. ? ?3/23: Clinically seems improving.  Still feeling weak.  Able to wean off to room air now. ?There was some trouble getting TPN from [facility], currently on trickle enteric feed and will reach his goal feeding by tomorrow.  Also having some mild diarrhea. ? ?3/24: Nursing concern of excessive upper respiratory thick secretions, requiring frequent suctioning.  Does not like a vibrating bed for chest PT. [place] is seen added. ?Surgery to progress to goal rate of tube feed today. ?We will continue holding TPN at this time. ?PT/OT evaluation for a potential discharge in next couple of days. ? ?Later informed by nursing staff that patient developed a transient episode of atrial fibrillation,  EKG was obtained which is consistent with rate controlled atrial fibrillation.  Patient reverted back to sinus rhythm spontaneously after a very short time.  We will monitor at this time. ? ?3/25: Patient did develop another episode of hypotension yesterday evening requiring 1 L of bolus with  improvement in blood pressure.  Worsening leukocytosis and low-grade fever this morning.  Patient continued to scream help whenever awake and resting in between. ?Repeat CT with some concern of worsening bilateral pleural effusion and new groundglass opacities in the upper lobe, broad differential which include aspiration pneumonia, patient is already on antibiotics.  CT abdomen improving and complete resolution of prior pneumoperitoneum. ?Checking procalcitonin. ? ?Patient developed another febrile episode with temperature of 101.6.  Checking blood and urine cultures, checking respiratory cultures.  Discussed with son to revisit the goals of care, he will discuss with his dad and come back to [place]. ? ?Patient is very high risk for deterioration.  Will request palliative care to revisit as family might have changed their perspective. ? ?3/26: Patient continued to decline.  Becoming hypotensive and febrile again.  Repeat blood cultures remain negative, respiratory culture and urine cultures pending.  Will check MRSA swab and it came back positive.  CT chest which was done yesterday with some concern of new bilateral groundglass opacities with a broad differential which include aspiration pneumonia versus an atypical infection.  He received last dose of Zosyn yesterday. ?Also had another discussion with son as he might be appropriate for comfort care. ?Giving some more boluses-if family decided to continue with medical care, he needs to be transferred back to ICU for pressors. ?Starting him on Unasyn, Zithromax and vancomycin. ?Patient also has diarrhea since started on tube feed, unable to explain any worsening of abdominal pain, always says yes to pain, worsening leukocytosis and new fever.  He was critically sick and received Zosyn for many days-high risk for C. difficile colitis-message sent to ID to proceed with C. difficile testing if appropriate. ? ?Had another discussion with son, family will be coming to see  him and then they will decide whether to proceed with comfort care only or stay aggressive. ? ?Family decided to proceed with comfort only. ? ?Assessment and Plan: ?* Pneumoperitoneum ?Secondary to dislodged intraperitoneal gastrostomy tube s/p exploratory laparotomy, repair, insertion of new gastrostomy tube and takedown of gastrocolonic fistula. ?GI series with no leak- ?Patient was started on enteric feeding with G-tube and TPN has been discontinued. ?Completed the course of Zosyn and Diflucan yesterday. ?-Continue with PPI IV twice daily. ?-Continue with supportive care ? ? ? ?Sepsis ([place]) ?Patient now met sepsis criteria with fever and leukocytosis.  He also had diarrhea since start off enteric feeding, initially thought to be due to tube feed and a change in formula was requested. ?Patient received Zosyn for 7 days. ?Repeat CT chest yesterday with new upper lobe groundglass opacities with a broad differential which include aspiration pneumonia or atypical infection. ?Procalcitonin at 0.36. ?CT abdomen with concern of some reflex, otherwise improvement from prior in terms of hemoperitoneum and no other obvious sign of infection. ?MRSA swab came back positive. ?Repeat blood cultures negative so far. ?Respiratory cultures and urine cultures are pending. ?Persistently declining and becoming hypotensive. ?-Starting him on Unasyn, Zithromax and vancomycin. ?-High risk for C. Difficile-message sent to ID. ?-Patient is very high risk for deterioration and death-family is leaning towards comfort care but has not make final decisions yet. ?-Continue to monitor ? ?Paroxysmal atrial fibrillation (HCC) ?Patient developed  a very transient episode of atrial fibrillation.  No prior history.  Remained rate controlled and no hemodynamic compromise. ?Spontaneously reverted back to sinus rhythm. ?-Continue to monitor. ?-No need for any anticoagulation at this time as patient is high risk for bleeding due to recent perforated  ulcer and surgeries. ? ?Acute respiratory failure with hypoxia ([place]) ?Resolved-patient is now on room air ?No baseline oxygen use. ?Repeat CT chest today with some new groundglass opacities with broad differentia

## 2022-01-09 NOTE — Progress Notes (Signed)
10am- Rounded with Dr. Nelson Chimes, patient hypotensive, with temp this morning. Patient in pain on awakening, yelling, "help me". Dr. Nelson Chimes stated she will call family and provide update. Orders to give 500cc bolus and do MRSA swab placed by MD.  ? ?12:00- Dr. Nelson Chimes notified that BP 80s/40s post bolus. ?She stated that son stated he will come soon and make decision if patient goes comfort care. ? ?12:30- MD notified BP still low.  ? ?13:30- This RN called and spoke to son to see what his time of arrival will be. He stated that he is trying to settle their baby and will come after bit. I informed son that patient BP was low despite fluids and that patient is declining.  ?He stated, I want my dad to be comfortable and  ?"I don't want to do anything inhumane"  ?I asked son if he was thinking of comfort care, he stated "yes", but can you keep him on the vitals monitor until I get there"  ?Dr. Nelson Chimes paged and notified of conversation. She stated to let her know when son arrives to bedside.  ? ?14:30- Dr. Nelson Chimes paged again due to patient sounding wet/gurgling. MD placed orders for Scopolamine patch.  ? ?14:42- Oxy given for pain- patient yelling "help me" PAINAD score 9/10.  ? ?  ?15:45- Dr. Nelson Chimes notified that son still has not arrived. BP still low (80s/40s-50s.) Dr. Nelson Chimes gave VORB for additional dose of oxycodone 5mg x1 for pain. She stated, okay to give with low BP.  ?17:30- Son, showed to bedside. He asked this RN if we had started comfort care orders and asked RN if we proceed with comfort care. Dr. Onalee Hua paged and notified and she stated to proceed with giving Dilaudid per PRN orders and she would place comfort orders.  ? ? ?

## 2022-01-09 NOTE — Assessment & Plan Note (Signed)
Patient developed a very transient episode of atrial fibrillation.  No prior history.  Remained rate controlled and no hemodynamic compromise. ?Spontaneously reverted back to sinus rhythm. ?-Continue to monitor. ?-No need for any anticoagulation at this time as patient is high risk for bleeding due to recent perforated ulcer and surgeries. ?

## 2022-01-09 NOTE — TOC Progression Note (Signed)
Transition of Care (TOC) - Progression Note  ? ? ?Patient Details  ?Name: Mike Parks ?MRN: [id number] ?Date of Birth: Nov 30, 1945 ? ?Transition of Care (TOC) CM/SW Contact  ?Zarif Rathje, LCSW ?Phone Number: [phone] ?01/09/2022, 11:48 AM ? ?Clinical Narrative:    ? ?Patient remains febrile, son and Attending will discuss other options for care.  Possible comfort care.  TOC will continue to follow. ? ? ?Expected Discharge Plan: [place] ?Barriers to Discharge: Continued Medical Work up ? ?Expected Discharge Plan and Services ?Expected Discharge Plan: [place] ?  ?Discharge Planning Services: CM Consult ?Post Acute Care Choice: [place] ?Living arrangements for the past 2 months: Apartment ?                ?DME Arranged: N/A ?DME Agency: NA ?  ?  ?  ?HH Arranged: NA ?[place] Agency: NA ?  ?  ?  ? ? ?Social Determinants of Health (SDOH) Interventions ?  ? ?Readmission Risk Interventions ?   ? View : No data to display.  ?  ?  ?  ? ? ?

## 2022-01-09 NOTE — Assessment & Plan Note (Signed)
Patient developed diarrhea requiring rectal seal after starting enteric feeding, might be related to formula.  Initially there was no worsening leukocytosis, always says belly pain, he also had an abdominal surgery.  No worsening leukocytosis and becoming febrile, also becoming hypotensive needing quite a bit of boluses. ?High risk for C. difficile colitis has received Zosyn for many days. ?-Message sent to ID as unable to proceed with orders at this time. ?-Continue to monitor ?

## 2022-01-10 DIAGNOSIS — K668 Other specified disorders of peritoneum: Secondary | ICD-10-CM | POA: Diagnosis not present

## 2022-01-10 LAB — GLUCOSE, CAPILLARY
Glucose-Capillary: 155 mg/dL — ABNORMAL HIGH (ref 70–99)
Glucose-Capillary: 81 mg/dL (ref 70–99)

## 2022-01-10 MED ORDER — LORAZEPAM 2 MG/ML PO CONC: 1.0000 mg | ORAL | Status: DC | PRN

## 2022-01-10 MED ORDER — HALOPERIDOL LACTATE 2 MG/ML PO CONC
0.5000 mg | ORAL | Status: DC | PRN
  Filled 2022-01-10: qty 0.3

## 2022-01-10 MED ORDER — LORAZEPAM 2 MG/ML IJ SOLN
1.0000 mg | INTRAMUSCULAR | Status: DC | PRN
  Administered 2022-01-11 – 2022-01-14 (×6): 1 mg via INTRAVENOUS
  Filled 2022-01-10 (×6): qty 1

## 2022-01-10 MED ORDER — HALOPERIDOL 0.5 MG PO TABS
0.5000 mg | ORAL_TABLET | ORAL | Status: DC | PRN
Start: 2022-01-10 — End: 2022-01-15
  Filled 2022-01-10: qty 1

## 2022-01-10 MED ORDER — ACETAMINOPHEN 325 MG PO TABS: 650.0000 mg | ORAL_TABLET | Freq: Four times a day (QID) | ORAL | Status: DC | PRN

## 2022-01-10 MED ORDER — HALOPERIDOL LACTATE 5 MG/ML IJ SOLN
0.5000 mg | INTRAMUSCULAR | Status: DC | PRN
  Administered 2022-01-12 – 2022-01-14 (×3): 0.5 mg via INTRAVENOUS
  Filled 2022-01-10 (×3): qty 1

## 2022-01-10 MED ORDER — LORAZEPAM 1 MG PO TABS: 1.0000 mg | ORAL_TABLET | ORAL | Status: DC | PRN

## 2022-01-10 MED ORDER — LOPERAMIDE HCL 1 MG/7.5ML PO SUSP
2.0000 mg | ORAL | Status: DC | PRN
  Filled 2022-01-10: qty 15

## 2022-01-10 MED ORDER — ACETAMINOPHEN 650 MG RE SUPP: 650.0000 mg | Freq: Four times a day (QID) | RECTAL | Status: DC | PRN

## 2022-01-10 NOTE — Assessment & Plan Note (Signed)
As patient continued to decline, family decided to proceed with comfort care only and withdrawing all the treatment. ?Patient now met sepsis criteria with fever and leukocytosis.  He also had diarrhea since start off enteric feeding, initially thought to be due to tube feed and a change in formula was requested. ?Patient received Zosyn for 7 days. ?Repeat CT chest yesterday with new upper lobe groundglass opacities with a broad differential which include aspiration pneumonia or atypical infection. ?Procalcitonin at 0.36. ?CT abdomen with concern of some reflex, otherwise improvement from prior in terms of hemoperitoneum and no other obvious sign of infection. ?MRSA swab came back positive. ?Repeat blood cultures negative so far. ?Respiratory cultures and urine cultures are pending. ?Persistently declining and becoming hypotensive. ?-Withdrawing all the antibiotics  ?-comfort care measures initiated ?-Referral sent for inpatient hospice care evaluation-family would like to transfer him to hospice home in [place] ? ?

## 2022-01-10 NOTE — Assessment & Plan Note (Signed)
Patient not transition to comfort care only.  Withdrawing all treatment. ?Secondary to dislodged intraperitoneal gastrostomy tube s/p exploratory laparotomy, repair, insertion of new gastrostomy tube and takedown of gastrocolonic fistula. ?GI series with no leak- ?Patient was started on enteric feeding with G-tube and TPN has been discontinued. ?Completed the course of Zosyn and Diflucan yesterday. ?-Continue with PPI IV twice daily. ?-Continue with supportive care ? ? ?

## 2022-01-10 NOTE — TOC Progression Note (Addendum)
Transition of Care (TOC) - Progression Note  ? ? ?Patient Details  ?Name: Mike Parks ?MRN: [id number] ?Date of Birth: [birth year] ? ?Transition of Care (TOC) CM/SW Contact  ?Margarito Liner, LCSW ?Phone Number: ?01/10/2022, 2:02 PM ? ?Clinical Narrative: Called son. Family has not discussed hospice at home vs facility. They will discuss and follow up with preference. Per MD, he would qualify for hospice home.  ? ?2:26 pm: MD just spoke with son and he prefers hospice home in [place]. Called him and confirmed. Odette Fraction with [facility] is aware and will reach out. ? ?Expected Discharge Plan: Skilled Nursing Facility ?Barriers to Discharge: Continued Medical Work up ? ?Expected Discharge Plan and Services ?Expected Discharge Plan: Skilled Nursing Facility ?  ?Discharge Planning Services: CM Consult ?Post Acute Care Choice: Skilled Nursing Facility ?Living arrangements for the past 2 months: Apartment ?                ?DME Arranged: N/A ?DME Agency: NA ?  ?  ?  ?HH Arranged: NA ?HH Agency: NA ?  ?  ?  ? ? ?Social Determinants of Health (SDOH) Interventions ?  ? ?Readmission Risk Interventions ?   ? View : No data to display.  ?  ?  ?  ? ? ?

## 2022-01-10 NOTE — Care Management Important Message (Signed)
Important Message ? ?Patient Details  ?Name: Mike Parks ?MRN: [id number] ?Date of Birth: July 11, 1946 ? ? ?Medicare Important Message Given:  Other (see comment) ? ?On comfort care measures.  Medicare IM withheld at this time out of respect for patient and family. ? ? ?Johnell Comings ?01/10/2022, 9:41 AM ?

## 2022-01-10 NOTE — Progress Notes (Signed)
PT Cancellation Note ? ?Patient Details ?Name: Mike Parks ?MRN: [id number] ?DOB: 06/16/1946 ? ? ?Cancelled Treatment:     Per chart pt planning to go comfort care & MD reports no further need for PT/OT. PT to complete current PT orders. ? ?Aleda Grana, PT, DPT ?01/10/22, 8:43 AM ? ? ?Sandi Mariscal ?01/10/2022, 8:42 AM ?

## 2022-01-10 NOTE — Progress Notes (Signed)
?Progress Note ? ? ?PatientMarland Kitchen JANIE Parks MEB:[id number] DOB: 10/17/1946 DOA: 12/31/2021     10 ?DOS: the patient was seen and examined on 01/10/2022 ?  ?Brief hospital course: ?Taken from prior notes. ? ?76 yo M with past medical history of multiple sclerosis, Barrett esophagus, dysphagia s/p PEG tube placement 11/2020, T2DM, COPD, hypothyroid, GERD, overactive bladder, as reviewed from EMR, presented at [facility] ED from home with complaints of abdominal pain in the setting of a suspected clogged PEG tube and coffee ground emesis since Thurs night 12/30/21. Patient had come to the ED on 12/29/21 because his PEG tube had become dislodged. Per chart review it appears as though the PEG tube was replaced bedside, no confirmatory imaging noted. Per Dr. Mont Dutton documentation & via telephone discussion with the patient's son, abdominal pain that began the evening of 12/29/21. The patient's son reported this abdominal pain worsened on 12/30/21 and then the patient began having coffee-ground emesis on the morning of 12/31/21. Per son, the patient has been able to take all his medications as prescribed. ?  ?ED course: ?Upon arrival the patient was hypotensive & tachycardic, spitting up foamy phlegm with coffee-ground material. CT imaging shows malposition of the PEG tube in the peritoneal cavity, not inside the intestine/stomach.  Pneumoperitoneum was also noted.  Surgery was contacted for admission and emergent intervention. ? ?Patient was taken to the OR for exploratory laparotomy on 12/31/2021.  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  Patient was taken to ICU postoperatively due to peritonitis and possible for perforation. ? ?He is currently weaned off from pressors. ? ?Worsening respiratory status, currently on heated high flow at 45 L of oxygen. ? ?He was also placed on TPN until bowel functions resume. ?He is  currently on Zosyn and Diflucan. ?Palliative care was also consulted to discuss goals of care. ?Very high risk for deterioration and death. ? ?24-Jan-2023; patient was started on low-dose Levophed due to persistent hypotension not responding to IV fluid.  Currently blood pressure stable.  Clinically seems the same.  General surgery is going to do GI series tomorrow via NG tube.  Palliative care was also consulted and a family meeting is planned for tomorrow around noon.  We will continue current level of care at this time. ? ?3/22: Patient had upper GI series which was negative for any leak, surgery is planning to start trickle feed through G-tube. ?Palliative care meeting with family, patient is not DNR with full scope of medical care.  If he deteriorates then they are open to discuss about comfort care but would like to continue current level of care at this time. ?Remains very lethargic but off the pressors now, able to wean off to 2 L of oxygen. ? ?3/23: Clinically seems improving.  Still feeling weak.  Able to wean off to room air now. ?There was some trouble getting TPN from [facility], currently on trickle enteric feed and will reach his goal feeding by tomorrow.  Also having some mild diarrhea. ? ?3/24: Nursing concern of excessive upper respiratory thick secretions, requiring frequent suctioning.  Does not like a vibrating bed for chest PT. Conniff is seen added. ?Surgery to progress to goal rate of tube feed today. ?We will continue holding TPN at this time. ?PT/OT evaluation for a potential discharge in next couple of days. ? ?Later informed by nursing staff that patient developed a transient episode of atrial fibrillation,  EKG was obtained which is consistent with rate controlled atrial fibrillation.  Patient reverted back to sinus rhythm spontaneously after a very short time.  We will monitor at this time. ? ?3/25: Patient did develop another episode of hypotension yesterday evening requiring 1 L of bolus with  improvement in blood pressure.  Worsening leukocytosis and low-grade fever this morning.  Patient continued to scream help whenever awake and resting in between. ?Repeat CT with some concern of worsening bilateral pleural effusion and new groundglass opacities in the upper lobe, broad differential which include aspiration pneumonia, patient is already on antibiotics.  CT abdomen improving and complete resolution of prior pneumoperitoneum. ?Checking procalcitonin. ? ?Patient developed another febrile episode with temperature of 101.6.  Checking blood and urine cultures, checking respiratory cultures.  Discussed with son to revisit the goals of care, he will discuss with his dad and come back to [place]. ? ?Patient is very high risk for deterioration.  Will request palliative care to revisit as family might have changed their perspective. ? ?3/26: Patient continued to decline.  Becoming hypotensive and febrile again.  Repeat blood cultures remain negative, respiratory culture and urine cultures pending.  Will check MRSA swab and it came back positive.  CT chest which was done yesterday with some concern of new bilateral groundglass opacities with a broad differential which include aspiration pneumonia versus an atypical infection.  He received last dose of Zosyn yesterday. ?Also had another discussion with son as he might be appropriate for comfort care. ?Giving some more boluses-if family decided to continue with medical care, he needs to be transferred back to ICU for pressors. ?Starting him on Unasyn, Zithromax and vancomycin. ?Patient also has diarrhea since started on tube feed, unable to explain any worsening of abdominal pain, always says yes to pain, worsening leukocytosis and new fever.  He was critically sick and received Zosyn for many days-high risk for C. difficile colitis-message sent to ID to proceed with C. difficile testing if appropriate. ? ?Family decided to make him comfort care only and withdrawing all  the treatment on 01/09/2022. ?Comfort care measures started and antibiotics were discontinued. ? ?Family decided to move him to hospice home in [place]. ?TOC to send referral for inpatient hospice care. ? ? ? ?Assessment and Plan: ?* Pneumoperitoneum ?Patient not transition to comfort care only.  Withdrawing all treatment. ?Secondary to dislodged intraperitoneal gastrostomy tube s/p exploratory laparotomy, repair, insertion of new gastrostomy tube and takedown of gastrocolonic fistula. ?GI series with no leak- ?Patient was started on enteric feeding with G-tube and TPN has been discontinued. ?Completed the course of Zosyn and Diflucan yesterday. ?-Continue with PPI IV twice daily. ?-Continue with supportive care ? ? ? ?Sepsis ([place]) ?As patient continued to decline, family decided to proceed with comfort care only and withdrawing all the treatment. ?Patient now met sepsis criteria with fever and leukocytosis.  He also had diarrhea since start off enteric feeding, initially thought to be due to tube feed and a change in formula was requested. ?Patient received Zosyn for 7 days. ?Repeat CT chest yesterday with new upper lobe groundglass opacities with a broad differential which include aspiration pneumonia or atypical infection. ?Procalcitonin at 0.36. ?CT abdomen with concern of some reflex, otherwise improvement from prior in terms of hemoperitoneum and no other obvious sign of infection. ?MRSA swab came back positive. ?Repeat blood cultures negative so far. ?Respiratory cultures and urine cultures are pending. ?Persistently declining and becoming hypotensive. ?-Withdrawing all the antibiotics  ?-comfort care measures  initiated ?-Referral sent for inpatient hospice care evaluation-family would like to transfer him to hospice home in [place] ? ? ?Paroxysmal atrial fibrillation (HCC) ?Patient developed a very transient episode of atrial fibrillation.  No prior history.  Remained rate controlled and no hemodynamic  compromise. ?Spontaneously reverted back to sinus rhythm. ?-Continue to monitor. ?-No need for any anticoagulation at this time as patient is high risk for bleeding due to recent perforated ulcer and surg

## 2022-01-10 NOTE — Progress Notes (Addendum)
[employer], [employer] [facility]) Hospital Liaison Note ?  ?Received request from Transitions of Care Manager Maralyn Sago for family interest in Hospice Home. Spoke with son/Mike Parks to confirm interest and explain services. ?  ?Approval for Hospice Home is determined by [facility] MD. Once [facility] MD has determined Hospice Home eligibility, [facility] will update hospital staff and family. Eligibility is approved. ?   ?Please do not hesitate to call with any hospice related questions.  ?  ?Thank you for the opportunity to participate in this patient's care. ?  ?Odette Fraction, MSW ?[facility] Hospital Liaison  ?[phone] ?

## 2022-01-11 DIAGNOSIS — K668 Other specified disorders of peritoneum: Secondary | ICD-10-CM | POA: Diagnosis not present

## 2022-01-11 LAB — CULTURE, RESPIRATORY W GRAM STAIN

## 2022-01-11 LAB — GLUCOSE, CAPILLARY: Glucose-Capillary: 69 mg/dL — ABNORMAL LOW (ref 70–99)

## 2022-01-11 MED ORDER — MUPIROCIN 2 % EX OINT
TOPICAL_OINTMENT | Freq: Two times a day (BID) | CUTANEOUS | Status: DC
  Filled 2022-01-11: qty 22

## 2022-01-11 NOTE — Plan of Care (Signed)
?  Problem: Nutrition: ?Goal: Adequate nutrition will be maintained ?Outcome: Not Progressing ?  ?Problem: Education: ?Goal: Knowledge of General Education information will improve ?Description: Including pain rating scale, medication(s)/side effects and non-pharmacologic comfort measures ?Outcome: Not Progressing ?  ?Problem: Health Behavior/Discharge Planning: ?Goal: Ability to manage health-related needs will improve ?Outcome: Not Progressing ?  ?Problem: Clinical Measurements: ?Goal: Ability to maintain clinical measurements within normal limits will improve ?Outcome: Not Progressing ?Goal: Will remain free from infection ?Outcome: Not Progressing ?Goal: Diagnostic test results will improve ?Outcome: Not Progressing ?Goal: Respiratory complications will improve ?Outcome: Not Progressing ?Goal: Cardiovascular complication will be avoided ?Outcome: Not Progressing ?  ?Problem: Activity: ?Goal: Risk for activity intolerance will decrease ?Outcome: Not Progressing ?  ?Problem: Nutrition: ?Goal: Adequate nutrition will be maintained ?Outcome: Not Progressing ?  ?Problem: Coping: ?Goal: Level of anxiety will decrease ?Outcome: Not Progressing ?  ?Problem: Elimination: ?Goal: Will not experience complications related to bowel motility ?Outcome: Not Progressing ?Goal: Will not experience complications related to urinary retention ?Outcome: Not Progressing ?  ?Problem: Pain Managment: ?Goal: General experience of comfort will improve ?Outcome: Not Progressing ?  ?Problem: Safety: ?Goal: Ability to remain free from injury will improve ?Outcome: Not Progressing ?  ?Problem: Skin Integrity: ?Goal: Risk for impaired skin integrity will decrease ?Outcome: Not Progressing ?  ?

## 2022-01-11 NOTE — Progress Notes (Signed)
?Progress Note ? ? ?PatientMarland Kitchen Mike Parks YBO:[id number] DOB: January 09, 1946 DOA: 12/31/2021     11 ?DOS: the patient was seen and examined on 01/11/2022 ?  ?Brief hospital course: ?Taken from prior notes. ? ?76 yo M with past medical history of multiple sclerosis, Barrett esophagus, dysphagia s/p PEG tube placement 11/2020, T2DM, COPD, hypothyroid, GERD, overactive bladder, as reviewed from EMR, presented at [facility] ED from home with complaints of abdominal pain in the setting of a suspected clogged PEG tube and coffee ground emesis since Thurs night 12/30/21. Patient had come to the ED on 12/29/21 because his PEG tube had become dislodged. Per chart review it appears as though the PEG tube was replaced bedside, no confirmatory imaging noted. Per Dr. Mont Dutton documentation & via telephone discussion with the patient's son, abdominal pain that began the evening of 12/29/21. The patient's son reported this abdominal pain worsened on 12/30/21 and then the patient began having coffee-ground emesis on the morning of 12/31/21. Per son, the patient has been able to take all his medications as prescribed. ?  ?ED course: ?Upon arrival the patient was hypotensive & tachycardic, spitting up foamy phlegm with coffee-ground material. CT imaging shows malposition of the PEG tube in the peritoneal cavity, not inside the intestine/stomach.  Pneumoperitoneum was also noted.  Surgery was contacted for admission and emergent intervention. ? ?Patient was taken to the OR for exploratory laparotomy on 12/31/2021.  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  Patient was taken to ICU postoperatively due to peritonitis and possible for perforation. ? ?He is currently weaned off from pressors. ? ?Worsening respiratory status, currently on heated high flow at 45 L of oxygen. ? ?He was also placed on TPN until bowel functions resume. ?He is  currently on Zosyn and Diflucan. ?Palliative care was also consulted to discuss goals of care. ?Very high risk for deterioration and death. ? ?01-10-2023; patient was started on low-dose Levophed due to persistent hypotension not responding to IV fluid.  Currently blood pressure stable.  Clinically seems the same.  General surgery is going to do GI series tomorrow via NG tube.  Palliative care was also consulted and a family meeting is planned for tomorrow around noon.  We will continue current level of care at this time. ? ?3/22: Patient had upper GI series which was negative for any leak, surgery is planning to start trickle feed through G-tube. ?Palliative care meeting with family, patient is not DNR with full scope of medical care.  If he deteriorates then they are open to discuss about comfort care but would like to continue current level of care at this time. ?Remains very lethargic but off the pressors now, able to wean off to 2 L of oxygen. ? ?3/23: Clinically seems improving.  Still feeling weak.  Able to wean off to room air now. ?There was some trouble getting TPN from [facility], currently on trickle enteric feed and will reach his goal feeding by tomorrow.  Also having some mild diarrhea. ? ?3/24: Nursing concern of excessive upper respiratory thick secretions, requiring frequent suctioning.  Does not like a vibrating bed for chest PT. Conniff is seen added. ?Surgery to progress to goal rate of tube feed today. ?We will continue holding TPN at this time. ?PT/OT evaluation for a potential discharge in next couple of days. ? ?Later informed by nursing staff that patient developed a transient episode of atrial fibrillation,  EKG was obtained which is consistent with rate controlled atrial fibrillation.  Patient reverted back to sinus rhythm spontaneously after a very short time.  We will monitor at this time. ? ?3/25: Patient did develop another episode of hypotension yesterday evening requiring 1 L of bolus with  improvement in blood pressure.  Worsening leukocytosis and low-grade fever this morning.  Patient continued to scream help whenever awake and resting in between. ?Repeat CT with some concern of worsening bilateral pleural effusion and new groundglass opacities in the upper lobe, broad differential which include aspiration pneumonia, patient is already on antibiotics.  CT abdomen improving and complete resolution of prior pneumoperitoneum. ?Checking procalcitonin. ? ?Patient developed another febrile episode with temperature of 101.6.  Checking blood and urine cultures, checking respiratory cultures.  Discussed with son to revisit the goals of care, he will discuss with his dad and come back to [place]. ? ?Patient is very high risk for deterioration.  Will request palliative care to revisit as family might have changed their perspective. ? ?3/26: Patient continued to decline.  Becoming hypotensive and febrile again.  Repeat blood cultures remain negative, respiratory culture and urine cultures pending.  Will check MRSA swab and it came back positive.  CT chest which was done yesterday with some concern of new bilateral groundglass opacities with a broad differential which include aspiration pneumonia versus an atypical infection.  He received last dose of Zosyn yesterday. ?Also had another discussion with son as he might be appropriate for comfort care. ?Giving some more boluses-if family decided to continue with medical care, he needs to be transferred back to ICU for pressors. ?Starting him on Unasyn, Zithromax and vancomycin. ?Patient also has diarrhea since started on tube feed, unable to explain any worsening of abdominal pain, always says yes to pain, worsening leukocytosis and new fever.  He was critically sick and received Zosyn for many days-high risk for C. difficile colitis-message sent to ID to proceed with C. difficile testing if appropriate. ? ?Family decided to make him comfort care only and withdrawing all  the treatment on 01/09/2022. ?Comfort care measures started and antibiotics were discontinued. ? ?Family decided to move him to hospice home in [place]. ?TOC to send referral for inpatient hospice care. ? ?3/28: Patient appears comfortable and remained stable.  There was no bed availability at hospice home today.  We will continue to monitor and transfer him to hospice home if remains stable and have a bed. ?Patient started moaning when I called his name, I told nurse to liberalize pain medications. ?Apparently he was just getting oxycodone, Dilaudid is on board and should be given to keep him comfortable. ? ? ? ? ?Assessment and Plan: ?* Pneumoperitoneum ?Patient not transition to comfort care only.  Withdrawing all treatment. ?Secondary to dislodged intraperitoneal gastrostomy tube s/p exploratory laparotomy, repair, insertion of new gastrostomy tube and takedown of gastrocolonic fistula. ?GI series with no leak- ?Patient was started on enteric feeding with G-tube and TPN has been discontinued. ?Completed the course of Zosyn and Diflucan yesterday. ?-Continue with PPI IV twice daily. ?-Continue with supportive care ? ? ? ?Sepsis ([place]) ?As patient continued to decline, family decided to proceed with comfort care only and withdrawing all the treatment. ?Patient now met sepsis criteria with fever and leukocytosis.  He also had diarrhea since start off enteric feeding, initially thought to be due to tube feed and a change in formula was requested. ?Patient received Zosyn for 7 days. ?Repeat CT chest yesterday with new  upper lobe groundglass opacities with a broad differential which include aspiration pneumonia or atypical infection. ?Procalcitonin at 0.36. ?CT abdomen with concern of some reflex, otherwise improvement from prior in terms of hemoperitoneum and no other obvious sign of infection. ?MRSA swab came back positive. ?Repeat blood cultures negative so far. ?Respiratory cultures and urine cultures are  pending. ?Persistently declining and becoming hypotensive. ?-Withdrawing all the antibiotics  ?-comfort care measures initiated ?-Referral sent for inpatient hospice care evaluation-family would like to transfer

## 2022-01-11 NOTE — Progress Notes (Signed)
Nutrition Brief Note  Chart reviewed. Pt now transitioning to comfort care.  No further nutrition interventions planned at this time.  Please re-consult as needed.   Sybella Harnish W, RD, LDN, CDCES Registered Dietitian II Certified Diabetes Care and Education Specialist Please refer to [facility] for RD and/or RD on-call/weekend/after hours pager   

## 2022-01-11 NOTE — Progress Notes (Signed)
[place] [facility]) Hospital Liaison Note ?  ?Unfortunately, Hospice Home is not able to offer a room today. Family and [facility] Manager aware hospital liaison will follow up tomorrow or sooner if a room becomes available.  ?  ?Please do not hesitate to call with any hospice related questions.  ?  ?Thank you for the opportunity to participate in this patient's care. ?  ?Daphene Calamity, MSW ?[place] ?[phone] ? ?

## 2022-01-12 DIAGNOSIS — K668 Other specified disorders of peritoneum: Secondary | ICD-10-CM | POA: Diagnosis not present

## 2022-01-12 NOTE — Progress Notes (Signed)
?Progress Note ? ? ?PatientMarland Kitchen Mike Parks WUJ:[id number] DOB: 1946-08-07 DOA: 12/31/2021     12 ?DOS: the patient was seen and examined on 01/12/2022 ?  ?Brief hospital course: ?Taken from prior notes. ? ?76 yo M with past medical history of multiple sclerosis, Barrett esophagus, dysphagia s/p PEG tube placement 11/2020, T2DM, COPD, hypothyroid, GERD, overactive bladder, as reviewed from EMR, presented at [facility] ED from home with complaints of abdominal pain in the setting of a suspected clogged PEG tube and coffee ground emesis since Thurs night 12/30/21. Patient had come to the ED on 12/29/21 because his PEG tube had become dislodged. Per chart review it appears as though the PEG tube was replaced bedside, no confirmatory imaging noted. Per Dr. Mont Dutton documentation & via telephone discussion with the patient's son, abdominal pain that began the evening of 12/29/21. The patient's son reported this abdominal pain worsened on 12/30/21 and then the patient began having coffee-ground emesis on the morning of 12/31/21. Per son, the patient has been able to take all his medications as prescribed. ?  ?ED course: ?Upon arrival the patient was hypotensive & tachycardic, spitting up foamy phlegm with coffee-ground material. CT imaging shows malposition of the PEG tube in the peritoneal cavity, not inside the intestine/stomach.  Pneumoperitoneum was also noted.  Surgery was contacted for admission and emergent intervention. ? ?Patient was taken to the OR for exploratory laparotomy on 12/31/2021.  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  Patient was taken to ICU postoperatively due to peritonitis and possible for perforation. ? ?He is currently weaned off from pressors. ? ?Worsening respiratory status, currently on heated high flow at 45 L of oxygen. ? ?He was also placed on TPN until bowel functions resume. ?He is  currently on Zosyn and Diflucan. ?Palliative care was also consulted to discuss goals of care. ?Very high risk for deterioration and death. ? ?02/02/23; patient was started on low-dose Levophed due to persistent hypotension not responding to IV fluid.  Currently blood pressure stable.  Clinically seems the same.  General surgery is going to do GI series tomorrow via NG tube.  Palliative care was also consulted and a family meeting is planned for tomorrow around noon.  We will continue current level of care at this time. ? ?3/22: Patient had upper GI series which was negative for any leak, surgery is planning to start trickle feed through G-tube. ?Palliative care meeting with family, patient is not DNR with full scope of medical care.  If he deteriorates then they are open to discuss about comfort care but would like to continue current level of care at this time. ?Remains very lethargic but off the pressors now, able to wean off to 2 L of oxygen. ? ?3/23: Clinically seems improving.  Still feeling weak.  Able to wean off to room air now. ?There was some trouble getting TPN from [facility], currently on trickle enteric feed and will reach his goal feeding by tomorrow.  Also having some mild diarrhea. ? ?3/24: Nursing concern of excessive upper respiratory thick secretions, requiring frequent suctioning.  Does not like a vibrating bed for chest PT. Conniff is seen added. ?Surgery to progress to goal rate of tube feed today. ?We will continue holding TPN at this time. ?PT/OT evaluation for a potential discharge in next couple of days. ? ?Later informed by nursing staff that patient developed a transient episode of atrial fibrillation,  EKG was obtained which is consistent with rate controlled atrial fibrillation.  Patient reverted back to sinus rhythm spontaneously after a very short time.  We will monitor at this time. ? ?3/25: Patient did develop another episode of hypotension yesterday evening requiring 1 L of bolus with  improvement in blood pressure.  Worsening leukocytosis and low-grade fever this morning.  Patient continued to scream help whenever awake and resting in between. ?Repeat CT with some concern of worsening bilateral pleural effusion and new groundglass opacities in the upper lobe, broad differential which include aspiration pneumonia, patient is already on antibiotics.  CT abdomen improving and complete resolution of prior pneumoperitoneum. ?Checking procalcitonin. ? ?Patient developed another febrile episode with temperature of 101.6.  Checking blood and urine cultures, checking respiratory cultures.  Discussed with son to revisit the goals of care, he will discuss with his dad and come back to [place]. ? ?Patient is very high risk for deterioration.  Will request palliative care to revisit as family might have changed their perspective. ? ?3/26: Patient continued to decline.  Becoming hypotensive and febrile again.  Repeat blood cultures remain negative, respiratory culture and urine cultures pending.  Will check MRSA swab and it came back positive.  CT chest which was done yesterday with some concern of new bilateral groundglass opacities with a broad differential which include aspiration pneumonia versus an atypical infection.  He received last dose of Zosyn yesterday. ?Also had another discussion with son as he might be appropriate for comfort care. ?Giving some more boluses-if family decided to continue with medical care, he needs to be transferred back to ICU for pressors. ?Starting him on Unasyn, Zithromax and vancomycin. ?Patient also has diarrhea since started on tube feed, unable to explain any worsening of abdominal pain, always says yes to pain, worsening leukocytosis and new fever.  He was critically sick and received Zosyn for many days-high risk for C. difficile colitis-message sent to ID to proceed with C. difficile testing if appropriate. ? ?Family decided to make him comfort care only and withdrawing all  the treatment on 01/09/2022. ?Comfort care measures started and antibiotics were discontinued. ? ?Family decided to move him to hospice home in [place]. ?TOC to send referral for inpatient hospice care. ? ?3/28: Patient appears comfortable and remained stable.  There was no bed availability at hospice home today.  We will continue to monitor and transfer him to hospice home if remains stable and have a bed. ?Patient started moaning when I called his name, I told nurse to liberalize pain medications. ?Apparently he was just getting oxycodone, Dilaudid is on board and should be given to keep him comfortable. ? ?3/29: Patient remained stable and comfortable.  Still no bed availability at hospice home. ? ? ?Assessment and Plan: ?* Pneumoperitoneum ?Patient not transition to comfort care only.  Withdrawing all treatment. ?Secondary to dislodged intraperitoneal gastrostomy tube s/p exploratory laparotomy, repair, insertion of new gastrostomy tube and takedown of gastrocolonic fistula. ?GI series with no leak- ?Patient was started on enteric feeding with G-tube and TPN has been discontinued. ?Completed the course of Zosyn and Diflucan yesterday. ?-Continue with PPI IV twice daily. ?-Continue with supportive care ? ? ? ?Sepsis ([place]) ?As patient continued to decline, family decided to proceed with comfort care only and withdrawing all the treatment. ?Patient now met sepsis criteria with fever and leukocytosis.  He also had diarrhea since start off enteric feeding, initially thought to be due to tube feed and a change in formula was  requested. ?Patient received Zosyn for 7 days. ?Repeat CT chest yesterday with new upper lobe groundglass opacities with a broad differential which include aspiration pneumonia or atypical infection. ?Procalcitonin at 0.36. ?CT abdomen with concern of some reflex, otherwise improvement from prior in terms of hemoperitoneum and no other obvious sign of infection. ?MRSA swab came back  positive. ?Repeat blood cultures negative so far. ?Respiratory cultures and urine cultures are pending. ?Persistently declining and becoming hypotensive. ?-Withdrawing all the antibiotics  ?-comfort care measures ini

## 2022-01-13 DIAGNOSIS — K668 Other specified disorders of peritoneum: Secondary | ICD-10-CM | POA: Diagnosis not present

## 2022-01-13 LAB — CULTURE, BLOOD (ROUTINE X 2)
Culture: NO GROWTH
Culture: NO GROWTH
Special Requests: ADEQUATE
Special Requests: ADEQUATE

## 2022-01-13 MED ORDER — HYDROMORPHONE HCL 1 MG/ML IJ SOLN
1.0000 mg | INTRAMUSCULAR | Status: DC | PRN
  Administered 2022-01-13 – 2022-01-14 (×7): 1 mg via INTRAVENOUS
  Filled 2022-01-13 (×7): qty 1

## 2022-01-13 NOTE — Progress Notes (Signed)
[facility] Hospital Liaison Note ?  ?Unfortunately, [facility] is not able to offer a room today. Family and TOC Manager aware hospital liaison will follow up tomorrow or sooner if a room becomes available.  ?  ?Please do not hesitate to call with any hospice related questions.  ?  ?Thank you for the opportunity to participate in this patient's care. ?  ?Shania Daniel, MSW ?[facility] Hospital Liaison ?[phone] ?

## 2022-01-13 NOTE — Progress Notes (Signed)
[facility]) Hospital Liaison Note ?  ?Unfortunately, [facility] is not able to offer a room today. Family and [facility] Manager aware hospital liaison will follow up tomorrow or sooner if a room becomes available.  ?  ?Please do not hesitate to call with any hospice related questions.  ?  ?Thank you for the opportunity to participate in this patient's care. ?  ?Daphene Calamity, MSW ?[facility] ?[phone] ?

## 2022-01-13 NOTE — Progress Notes (Signed)
Report called to [employer].  ?Son, [name], called and informed patient moving to room 123. ? ?

## 2022-01-13 NOTE — Care Management Important Message (Signed)
Important Message ? ?Patient Details  ?Name: Mike Parks ?MRN: [id number] ?Date of Birth: 06-Sep-1946 ? ? ?Medicare Important Message Given:  Other (see comment) ? ?On comfort care measures.  Medicare IM withheld at this time out of respect for patient and family. ? ? ?Mike Parks ?01/13/2022, 8:50 AM ?

## 2022-01-13 NOTE — Progress Notes (Signed)
Patient arrived from transfer unit via bed. Patient responsive only to pain.  ?

## 2022-01-13 NOTE — Progress Notes (Signed)
?Progress Note ? ? ?PatientMarland Parks Mike Parks ATF:[id number] DOB: 1945-11-25 DOA: 12/31/2021     13 ?DOS: the patient was seen and examined on 01/13/2022 ?  ?Brief hospital course: ?Taken from prior notes. ? ?76 yo M with past medical history of multiple sclerosis, Barrett esophagus, dysphagia s/p PEG tube placement 11/2020, T2DM, COPD, hypothyroid, GERD, overactive bladder, as reviewed from EMR, presented at [facility] ED from home with complaints of abdominal pain in the setting of a suspected clogged PEG tube and coffee ground emesis since Thurs night 12/30/21. Patient had come to the ED on 12/29/21 because his PEG tube had become dislodged. Per chart review it appears as though the PEG tube was replaced bedside, no confirmatory imaging noted. Per Dr. Mont Dutton documentation & via telephone discussion with the patient's son, abdominal pain that began the evening of 12/29/21. The patient's son reported this abdominal pain worsened on 12/30/21 and then the patient began having coffee-ground emesis on the morning of 12/31/21. Per son, the patient has been able to take all his medications as prescribed. ?  ?ED course: ?Upon arrival the patient was hypotensive & tachycardic, spitting up foamy phlegm with coffee-ground material. CT imaging shows malposition of the PEG tube in the peritoneal cavity, not inside the intestine/stomach.  Pneumoperitoneum was also noted.  Surgery was contacted for admission and emergent intervention. ? ?Patient was taken to the OR for exploratory laparotomy on 12/31/2021.  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  Patient was taken to ICU postoperatively due to peritonitis and possible for perforation. ? ?He is currently weaned off from pressors. ? ?Worsening respiratory status, currently on heated high flow at 45 L of oxygen. ? ?He was also placed on TPN until bowel functions resume. ?He is  currently on Zosyn and Diflucan. ?Palliative care was also consulted to discuss goals of care. ?Very high risk for deterioration and death. ? ?2023/02/03; patient was started on low-dose Levophed due to persistent hypotension not responding to IV fluid.  Currently blood pressure stable.  Clinically seems the same.  General surgery is going to do GI series tomorrow via NG tube.  Palliative care was also consulted and a family meeting is planned for tomorrow around noon.  We will continue current level of care at this time. ? ?3/22: Patient had upper GI series which was negative for any leak, surgery is planning to start trickle feed through G-tube. ?Palliative care meeting with family, patient is not DNR with full scope of medical care.  If he deteriorates then they are open to discuss about comfort care but would like to continue current level of care at this time. ?Remains very lethargic but off the pressors now, able to wean off to 2 L of oxygen. ? ?3/23: Clinically seems improving.  Still feeling weak.  Able to wean off to room air now. ?There was some trouble getting TPN from [facility], currently on trickle enteric feed and will reach his goal feeding by tomorrow.  Also having some mild diarrhea. ? ?3/24: Nursing concern of excessive upper respiratory thick secretions, requiring frequent suctioning.  Does not like a vibrating bed for chest PT. Conniff is seen added. ?Surgery to progress to goal rate of tube feed today. ?We will continue holding TPN at this time. ?PT/OT evaluation for a potential discharge in next couple of days. ? ?Later informed by nursing staff that patient developed a transient episode of atrial fibrillation,  EKG was obtained which is consistent with rate controlled atrial fibrillation.  Patient reverted back to sinus rhythm spontaneously after a very short time.  We will monitor at this time. ? ?3/25: Patient did develop another episode of hypotension yesterday evening requiring 1 L of bolus with  improvement in blood pressure.  Worsening leukocytosis and low-grade fever this morning.  Patient continued to scream help whenever awake and resting in between. ?Repeat CT with some concern of worsening bilateral pleural effusion and new groundglass opacities in the upper lobe, broad differential which include aspiration pneumonia, patient is already on antibiotics.  CT abdomen improving and complete resolution of prior pneumoperitoneum. ?Checking procalcitonin. ? ?Patient developed another febrile episode with temperature of 101.6.  Checking blood and urine cultures, checking respiratory cultures.  Discussed with son to revisit the goals of care, he will discuss with his dad and come back to [place]. ? ?Patient is very high risk for deterioration.  Will request palliative care to revisit as family might have changed their perspective. ? ?3/26: Patient continued to decline.  Becoming hypotensive and febrile again.  Repeat blood cultures remain negative, respiratory culture and urine cultures pending.  Will check MRSA swab and it came back positive.  CT chest which was done yesterday with some concern of new bilateral groundglass opacities with a broad differential which include aspiration pneumonia versus an atypical infection.  He received last dose of Zosyn yesterday. ?Also had another discussion with son as he might be appropriate for comfort care. ?Giving some more boluses-if family decided to continue with medical care, he needs to be transferred back to ICU for pressors. ?Starting him on Unasyn, Zithromax and vancomycin. ?Patient also has diarrhea since started on tube feed, unable to explain any worsening of abdominal pain, always says yes to pain, worsening leukocytosis and new fever.  He was critically sick and received Zosyn for many days-high risk for C. difficile colitis-message sent to ID to proceed with C. difficile testing if appropriate. ? ?Family decided to make him comfort care only and withdrawing all  the treatment on 01/09/2022. ?Comfort care measures started and antibiotics were discontinued. ? ?Family decided to move him to hospice home in [place]. ?TOC to send referral for inpatient hospice care. ? ?3/28: Patient appears comfortable and remained stable.  There was no bed availability at hospice home today.  We will continue to monitor and transfer him to hospice home if remains stable and have a bed. ?Patient started moaning when I called his name, I told nurse to liberalize pain medications. ?Apparently he was just getting oxycodone, Dilaudid is on board and should be given to keep him comfortable. ? ?3/29: Patient remained stable and comfortable.  Still no bed availability at hospice home. ? ?3/30: Patient remained stable.  Only responding to painful stimuli.  Still no bed availability. ? ? ?Assessment and Plan: ?* Pneumoperitoneum ?Patient not transition to comfort care only.  Withdrawing all treatment. ?Secondary to dislodged intraperitoneal gastrostomy tube s/p exploratory laparotomy, repair, insertion of new gastrostomy tube and takedown of gastrocolonic fistula. ?GI series with no leak- ?Patient was started on enteric feeding with G-tube and TPN has been discontinued. ?Completed the course of Zosyn and Diflucan yesterday. ?-Continue with PPI IV twice daily. ?-Continue with supportive care ? ? ? ?Sepsis ([place]) ?As patient continued to decline, family decided to proceed with comfort care only and withdrawing all the treatment. ?Patient now met sepsis criteria with fever and leukocytosis.  He also had diarrhea since start off  enteric feeding, initially thought to be due to tube feed and a change in formula was requested. ?Patient received Zosyn for 7 days. ?Repeat CT chest yesterday with new upper lobe groundglass opacities with a broad differential which include aspiration pneumonia or atypical infection. ?Procalcitonin at 0.36. ?CT abdomen with concern of some reflex, otherwise improvement from prior  in terms of hemoperitoneum and no other obvious sign of infection. ?MRSA swab came back positive. ?Repeat blood cultures negative so far. ?Respiratory cultures and urine cultures are pending. ?Persistently

## 2022-01-14 DIAGNOSIS — K668 Other specified disorders of peritoneum: Secondary | ICD-10-CM | POA: Diagnosis not present

## 2022-01-14 MED ORDER — MORPHINE SULFATE (PF) 2 MG/ML IV SOLN
2.0000 mg | INTRAVENOUS | Status: DC | PRN
  Administered 2022-01-14 – 2022-01-15 (×7): 2 mg via INTRAVENOUS
  Filled 2022-01-14 (×7): qty 1

## 2022-01-14 NOTE — Progress Notes (Addendum)
?Progress Note ? ? ?PatientMarland Kitchen CLEVON Parks WVP:[id number] DOB: 09-13-1946 DOA: 12/31/2021     14 ?DOS: the patient was seen and examined on 01/14/2022 ?  ?Brief hospital course: ?Taken from prior notes. ? ?76 yo M with past medical history of multiple sclerosis, Barrett esophagus, dysphagia s/p PEG tube placement 11/2020, T2DM, COPD, hypothyroid, GERD, overactive bladder, as reviewed from EMR, presented at [facility] ED from home with complaints of abdominal pain in the setting of a suspected clogged PEG tube and coffee ground emesis since Thurs night 12/30/21. Patient had come to the ED on 12/29/21 because his PEG tube had become dislodged. Per chart review it appears as though the PEG tube was replaced bedside, no confirmatory imaging noted. Per Dr. Mont Dutton documentation & via telephone discussion with the patient's son, abdominal pain that began the evening of 12/29/21. The patient's son reported this abdominal pain worsened on 12/30/21 and then the patient began having coffee-ground emesis on the morning of 12/31/21. Per son, the patient has been able to take all his medications as prescribed. ?  ?ED course: ?Upon arrival the patient was hypotensive & tachycardic, spitting up foamy phlegm with coffee-ground material. CT imaging shows malposition of the PEG tube in the peritoneal cavity, not inside the intestine/stomach.  Pneumoperitoneum was also noted.  Surgery was contacted for admission and emergent intervention. ? ?Patient was taken to the OR for exploratory laparotomy on 12/31/2021.  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  Patient was taken to ICU postoperatively due to peritonitis and possible for perforation. ? ?He is currently weaned off from pressors. ? ?Worsening respiratory status, currently on heated high flow at 45 L of oxygen. ? ?He was also placed on TPN until bowel functions resume. ?He is  currently on Zosyn and Diflucan. ?Palliative care was also consulted to discuss goals of care. ?Very high risk for deterioration and death. ? ?23-Jan-2023; patient was started on low-dose Levophed due to persistent hypotension not responding to IV fluid.  Currently blood pressure stable.  Clinically seems the same.  General surgery is going to do GI series tomorrow via NG tube.  Palliative care was also consulted and a family meeting is planned for tomorrow around noon.  We will continue current level of care at this time. ? ?3/22: Patient had upper GI series which was negative for any leak, surgery is planning to start trickle feed through G-tube. ?Palliative care meeting with family, patient is not DNR with full scope of medical care.  If he deteriorates then they are open to discuss about comfort care but would like to continue current level of care at this time. ?Remains very lethargic but off the pressors now, able to wean off to 2 L of oxygen. ? ?3/23: Clinically seems improving.  Still feeling weak.  Able to wean off to room air now. ?There was some trouble getting TPN from [facility], currently on trickle enteric feed and will reach his goal feeding by tomorrow.  Also having some mild diarrhea. ? ?3/24: Nursing concern of excessive upper respiratory thick secretions, requiring frequent suctioning.  Does not like a vibrating bed for chest PT. Conniff is seen added. ?Surgery to progress to goal rate of tube feed today. ?We will continue holding TPN at this time. ?PT/OT evaluation for a potential discharge in next couple of days. ? ?Later informed by nursing staff that patient developed a transient episode of atrial fibrillation,  EKG was obtained which is consistent with rate controlled atrial fibrillation.  Patient reverted back to sinus rhythm spontaneously after a very short time.  We will monitor at this time. ? ?3/25: Patient did develop another episode of hypotension yesterday evening requiring 1 L of bolus with  improvement in blood pressure.  Worsening leukocytosis and low-grade fever this morning.  Patient continued to scream help whenever awake and resting in between. ?Repeat CT with some concern of worsening bilateral pleural effusion and new groundglass opacities in the upper lobe, broad differential which include aspiration pneumonia, patient is already on antibiotics.  CT abdomen improving and complete resolution of prior pneumoperitoneum. ?Checking procalcitonin. ? ?Patient developed another febrile episode with temperature of 101.6.  Checking blood and urine cultures, checking respiratory cultures.  Discussed with son to revisit the goals of care, he will discuss with his dad and come back to [place]. ? ?Patient is very high risk for deterioration.  Will request palliative care to revisit as family might have changed their perspective. ? ?3/26: Patient continued to decline.  Becoming hypotensive and febrile again.  Repeat blood cultures remain negative, respiratory culture and urine cultures pending.  Will check MRSA swab and it came back positive.  CT chest which was done yesterday with some concern of new bilateral groundglass opacities with a broad differential which include aspiration pneumonia versus an atypical infection.  He received last dose of Zosyn yesterday. ?Also had another discussion with son as he might be appropriate for comfort care. ?Giving some more boluses-if family decided to continue with medical care, he needs to be transferred back to ICU for pressors. ?Starting him on Unasyn, Zithromax and vancomycin. ?Patient also has diarrhea since started on tube feed, unable to explain any worsening of abdominal pain, always says yes to pain, worsening leukocytosis and new fever.  He was critically sick and received Zosyn for many days-high risk for C. difficile colitis-message sent to ID to proceed with C. difficile testing if appropriate. ? ?Family decided to make him comfort care only and withdrawing all  the treatment on 01/09/2022. ?Comfort care measures started and antibiotics were discontinued. ? ?Family decided to move him to hospice home in [place]. ?TOC to send referral for inpatient hospice care. ? ?3/28: Patient appears comfortable and remained stable.  There was no bed availability at hospice home today.  We will continue to monitor and transfer him to hospice home if remains stable and have a bed. ?Patient started moaning when I called his name, I told nurse to liberalize pain medications. ?Apparently he was just getting oxycodone, Dilaudid is on board and should be given to keep him comfortable. ? ?3/29: Patient remained stable and comfortable.  Still no bed availability at hospice home. ? ?3/30: Patient remained stable.  Only responding to painful stimuli.  Still no bed availability. ? ? ?Above hospital course reviewed from previous notes, I started taking care of patient on 01/14/2022.  Received patient with comfort measures orders. ? ? ?Assessment and Plan: ?* Pneumoperitoneum ?Patient not transition to comfort care only.  Withdrawing all treatment. ?Secondary to dislodged intraperitoneal gastrostomy tube s/p exploratory laparotomy, repair, insertion of new gastrostomy tube and takedown of gastrocolonic fistula. ?GI series with no leak- ?Patient was started on enteric feeding with G-tube and TPN has been discontinued. ?Completed the course of Zosyn and Diflucan yesterday. ?-Continue with PPI IV twice daily. ?-Continue with supportive care ? ? ? ?Sepsis ([place]) ?As patient continued to decline, family decided to proceed with comfort  care only and withdrawing all the treatment. ?Patient now met sepsis criteria with fever and leukocytosis.  He also had diarrhea since start off enteric feeding, initially thought to be due to tube feed and a change in formula was requested. ?Patient received Zosyn for 7 days. ?Repeat CT chest yesterday with new upper lobe groundglass opacities with a broad differential which  include aspiration pneumonia or atypical infection. ?Procalcitonin at 0.36. ?CT abdomen with concern of some reflex, otherwise improvement from prior in terms of hemoperitoneum and no other obvious sign

## 2022-01-14 NOTE — Progress Notes (Signed)
Pt Is having episodes of air hunger. NP morrison made aware and placed order for morphine 2 mg IV every 2 hours PRN for air hunger. Will continue to monitor. ?

## 2022-01-14 NOTE — Care Management Important Message (Signed)
Important Message ? ?Patient Details  ?Name: Mike Parks ?MRN: [id number] ?Date of Birth: January 18, 1946 ? ? ?Medicare Important Message Given:  Other (see comment) ? ?Patient is on comfort care and will transition to the Hospice Home when bed available. Out of respect for the patient and family no Important Message from [facility] given. ? ? ?Olegario Messier A Sunya Humbarger ?01/14/2022, 8:34 AM ?

## 2022-01-14 NOTE — Progress Notes (Incomplete)
[facility] Progress Note ? ?Patient: Mike Parks    [id number]  DOA: 12/31/2021    ? ?Date of Service: the patient was seen and examined on 01/14/2022 ? ?Chief Complaint  ?Patient presents with  ? Hematemesis  ? ?Brief hospital course: ?*** ?Currently further plan is ***. ? ?Assessment and Plan: ?1. *** ?*** ? ?2. *** ?*** ? ?3. *** ?*** ? ?4. *** ?*** ? ?5. *** ?*** ? ?6. *** ?*** ? ?Body mass index is 21.68 kg/m?Marland Kitchen  ?Nutrition Problem: Moderate Malnutrition ?Etiology: chronic illness (MS, COPD, dysphagia) ?Interventions: ?Interventions: TPN ? ?Pressure Injury 01/05/22 Buttocks Bilateral;Mid Stage 1 -  Intact skin with non-blanchable redness of a localized area usually over a bony prominence. Redness, blanchable in some spots. Small deep purple area; this is a bruise, not a pressure injury (Active)  ?01/05/22 0700  ?Location: Buttocks  ?Location Orientation: Bilateral;Mid  ?Staging: Stage 1 -  Intact skin with non-blanchable redness of a localized area usually over a bony prominence.  ?Wound Description (Comments): Redness, blanchable in some spots. Small deep purple area; this is a bruise, not a pressure injury  ?Present on Admission:   ?Dressing Type Foam - Lift dressing to assess site every shift 01/13/22 0741  ?  ? ?Diet: *** ?DVT Prophylaxis: {DVTprophylaxisprnv:22509}  ? ?Advance goals of care discussion: {Palliative Code status:23503} ? ?Family Communication: ***family was present at bedside, at the time of interview.  ?***The pt provided permission to discuss medical plan with the family. Opportunity was given to ask question and all questions were answered satisfactorily.  ? ?Disposition:  ?Pt is from ***, admitted with ***, still has ***, which precludes a safe discharge. ?Discharge to ***, when ***. ? ?Subjective: *** ? ?Physical Exam: ?General: *** {EXAM; NEURO ALERTNESS:23097} {EXAM; NEURO PED ORIENTATION:18734}.  ?Appear in {DEGREE - MILD, MOD, SEV:22033} distress, affect {Exam;  psychiatric affect:30301} ?Eyes: PERRL*** ?ENT: Oral Mucosa {oral exam:22516}  ?Neck: *** JVD,  ?Cardiovascular: S1 and S2 Present, *** Murmur,  ?Respiratory: {Desc; increased/descreased:10091} respiratory effort, Bilateral Air entry equal and Decreased, *** Crackles, *** wheezes ?Abdomen: Bowel Sound ***sent, Soft and *** tenderness,  ?Skin: *** ?Extremities: *** Pedal edema, *** calf tenderness ?Neurologic: {neuro detail prnv:22517::"without any new focal findings"} ?Gait not checked due to patient safety concerns ? ?Vitals:  ? 01/13/22 0727 01/13/22 2040 01/14/22 1145 01/14/22 1347  ?BP: 113/61 (!) 106/53    ?Pulse: 75     ?Resp: 20 20 (!) 28 (!) 34  ?Temp: 98.5 ?F (36.9 ?C) 98 ?F (36.7 ?C)    ?TempSrc:  Oral    ?SpO2: 98% 92%    ?Weight:      ?Height:      ? ? ?Intake/Output Summary (Last 24 hours) at 01/14/2022 1352 ?Last data filed at 01/14/2022 0636 ?Gross per 24 hour  ?Intake --  ?Output 595 ml  ?Net -595 ml  ? ?Filed Weights  ? 01/10/22 0500 01/11/22 0504 01/12/22 0500  ?Weight: 62.4 kg 62.8 kg 66.6 kg  ? ? ?Data Reviewed: ?I have personally reviewed and interpreted daily labs, tele strips, imagings as discussed above. ?I reviewed all nursing notes, pharmacy notes, vitals, pertinent old records ?I have discussed plan of care as described above with RN and patient/family. ? ?CBC: ?Recent Labs  ?Lab 01/08/22 ?[id number] 01/09/22 ?[id number]  ?WBC 14.1* 17.1*  ?HGB 9.3* 9.1*  ?HCT 29.2* 28.4*  ?MCV 93.3 92.8  ?PLT 288 358  ? ?Basic Metabolic Panel: ?Recent Labs  ?Lab 01/08/22 ?[id number]  ?NA 141  ?K  4.1  ?CL 110  ?CO2 24  ?GLUCOSE 168*  ?BUN 37*  ?CREATININE 1.10  ?CALCIUM 7.5*  ? ? ?Studies: ?No results found.  ?Scheduled Meds: ? chlorhexidine  15 mL Mouth Rinse BID  ? Chlorhexidine Gluconate Cloth  6 each Topical Daily  ? levothyroxine  75 mcg Per Tube Daily  ? liver oil-zinc oxide   Topical TID  ? mouth rinse  15 mL Mouth Rinse q12n4p  ? mupirocin ointment   Nasal BID  ? pantoprazole sodium  40 mg Per Tube BID  ? QUEtiapine   25 mg Per Tube BID  ? scopolamine  1 patch Transdermal Q72H  ? sodium chloride flush  10-40 mL Intracatheter Q12H  ? ?Continuous Infusions: ? sodium chloride Stopped (01/07/22 1033)  ? sodium chloride    ? ?PRN Meds: sodium chloride, acetaminophen (TYLENOL) oral liquid 160 mg/5 mL, acetaminophen **OR** acetaminophen, antiseptic oral rinse, diphenhydrAMINE, glycopyrrolate **OR** glycopyrrolate **OR** glycopyrrolate, guaiFENesin, haloperidol **OR** haloperidol **OR** haloperidol lactate, HYDROmorphone (DILAUDID) injection, loperamide HCl, LORazepam **OR** LORazepam **OR** LORazepam, morphine injection, ondansetron **OR** ondansetron (ZOFRAN) IV, oxyCODONE, polyvinyl alcohol, sodium chloride (PF), sodium chloride flush ? ?Time spent: 35 minutes ? ?Author: ?Val Riles MD ?[facility] ?01/14/2022 1:52 PM ? ?To reach On-call, see care teams to locate the attending and reach out to them via [email or web address]. ?If 7PM-7AM, please contact night-coverage ?If you still have difficulty reaching the attending provider, please page the [facility] (Director on Call) for [facility] on amion for assistance. ? ?

## 2022-01-14 NOTE — Progress Notes (Signed)
CNA notified me of 02 sat or 65. I immediately assessed pt currently at 2L. I increase o2 to 6L  o2 increased to 72% on 6L. I have given PRN morphine, ativan  and glycpyrrolate I paged the MD d/t pt being on comfort measures. He instructed me to take measures to keep pt above 88%. ?pt is now up to 91% on non rebreather @ 15 L. Will continue to monitor pt.  ?

## 2022-01-14 NOTE — Progress Notes (Signed)
[facility]) Hospital Liaison Note ?  ?Unfortunately, Hospice Home is not able to offer a room today. Family and [facility] Manager aware hospital liaison will follow up tomorrow or sooner if a room becomes available.  ?  ?Please do not hesitate to call with any hospice related questions.  ?  ?Thank you for the opportunity to participate in this patient's care. ?  ?Daphene Calamity, MSW ?[place] ?[phone] ?

## 2022-01-14 NOTE — Plan of Care (Signed)
  Problem: Pain Managment: Goal: General experience of comfort will improve Outcome: Progressing   Problem: Safety: Goal: Ability to remain free from injury will improve Outcome: Progressing   

## 2022-01-15 DIAGNOSIS — K668 Other specified disorders of peritoneum: Secondary | ICD-10-CM | POA: Diagnosis not present

## 2022-01-15 MED ORDER — LORAZEPAM 2 MG/ML IJ SOLN: 1.0000 mg | INTRAMUSCULAR | 0 refills | Status: AC | PRN

## 2022-01-15 MED ORDER — SCOPOLAMINE 1 MG/3DAYS TD PT72
1.0000 | MEDICATED_PATCH | TRANSDERMAL | 12 refills | Status: AC
Start: 2022-01-15 — End: ?

## 2022-01-15 MED ORDER — HALOPERIDOL LACTATE 5 MG/ML IJ SOLN
0.5000 mg | INTRAMUSCULAR | Status: AC | PRN
Start: 2022-01-15 — End: ?

## 2022-01-15 MED ORDER — MORPHINE SULFATE (PF) 2 MG/ML IV SOLN: 2.0000 mg | INTRAVENOUS | 0 refills | Status: AC | PRN

## 2022-01-15 MED ORDER — GLYCOPYRROLATE 0.2 MG/ML IJ SOLN: 0.2000 mg | INTRAMUSCULAR | Status: AC | PRN

## 2022-02-14 NOTE — Progress Notes (Signed)
Civil engineer, contracting (ACC) ? ?There is not a bed to offer Mr. Lanuza today at [facility]. ? ?ACC will update hospital staff and family once our bed status changes. ? ?[name] BSN, RN ?[facility] Liaison  ?

## 2022-02-14 NOTE — Progress Notes (Signed)
Civil engineer, contracting (ACC) ? ?Update, there is a bed to offer Mr. Palmeri today at [facility]. ? ?The family has accepted the bed. ? ?Once necessary consents are completed, ACC will update TOC manager and transport can be arranged. ? ?RN staff, please call report to [phone]. ? ?Thank you, ?[name] BSN, RN ?[facility] Liaison  ? ? ?

## 2022-02-14 NOTE — TOC Transition Note (Addendum)
Transition of Care (TOC) - CM/SW Discharge Note ? ? ?Patient Details  ?Name: Mike Parks ?MRN: [id number] ?Date of Birth: 1945/11/21 ? ?Transition of Care (TOC) CM/SW Contact:  ?Patsey Pitstick E Teal Raben, LCSW ?Phone Number: ?01/26/22, 12:11 PM ? ? ?Clinical Narrative:    ?Patient has a bed at [facility] today. ?Family has been updated by [facility] Representative [name].  ?DNR left in DC packet and MD agreed to sign. ?EMS paperwork completed.  ?RN and MD notified.  ?Will call for EMS when RN and Hospice is ready.  ? ?12:40- Per [name] hospice home will be ready to receive patient at 3pm.  ?CSW called [facility] and added patient o list for 3pm. ? ? ?Final next level of care: Hospice Medical Facility ?Barriers to Discharge: Barriers Resolved ? ? ?Patient Goals and CMS Choice ?Patient states their goals for this hospitalization and ongoing recovery are:: son agrees with short term rehab ?CMS Medicare.gov Compare Post Acute Care list provided to:: Patient Represenative (must comment) ?Choice offered to / list presented to : Adult Children, Spouse ? ?Discharge Placement ?  ?           ?  ?Patient to be transferred to facility by: [facility] ?  ?Patient and family notified of of transfer: 01-26-22 ? ?Discharge Plan and Services ?  ?Discharge Planning Services: CM Consult ?Post Acute Care Choice: Skilled Nursing Facility          ?DME Arranged: N/A ?DME Agency: NA ?  ?  ?  ?HH Arranged: NA ?HH Agency: NA ?  ?  ?  ? ?Social Determinants of Health (SDOH) Interventions ?  ? ? ?Readmission Risk Interventions ?   ? View : No data to display.  ?  ?  ?  ? ? ? ? ? ?

## 2022-02-14 NOTE — Discharge Summary (Signed)
[facility] Discharge Summary ? ? ?Patient: Mike Parks ZOX:[id number]  PCP: [facility]  ?Date of admission: 12/31/2021   Date of discharge:  01/29/2022   ?  ?Discharge Diagnoses:  ?Principal Problem: ?  Pneumoperitoneum ?Active Problems: ?  Sepsis ([place]) ?  Acute respiratory failure with hypoxia ([place]) ?  Paroxysmal atrial fibrillation ([facility]) ?  Hypotension ?  Malnutrition of moderate degree ?  Hypothyroidism ?  Pressure injury of skin ?  Diarrhea ? ? ?Admitted From: Home  ?Disposition: Hospice facility ? ?Recommendations for Outpatient Follow-up:  ?F/u hospice care for comfort measures ?Follow up LABS/TEST:   ? ? ?Diet recommendation: NPO continue comfort measures, high risk of aspiration ? ?Activity: The patient is advised to gradually reintroduce usual activities, as tolerated ? ?Discharge Condition: stable ? ?Code Status: DNR  ? ?History of present illness: As per the H and P dictated on admission ?Hospital Course:  ?Taken from prior notes. ?76 yo M with past medical history of multiple sclerosis, Barrett esophagus, dysphagia s/p PEG tube placement 11/2020, T2DM, COPD, hypothyroid, GERD, overactive bladder, as reviewed from EMR, presented at [facility] ED from home with complaints of abdominal pain in the setting of a suspected clogged PEG tube and coffee ground emesis since Thurs night 12/30/21. Patient had come to the ED on 12/29/21 because his PEG tube had become dislodged. Per chart review it appears as though the PEG tube was replaced bedside, no confirmatory imaging noted. Per Dr. Mont Dutton documentation & via telephone discussion with the patient's son, abdominal pain that began the evening of 12/29/21. The patient's son reported this abdominal pain worsened on 12/30/21 and then the patient began having coffee-ground emesis on the morning of 12/31/21. Per son, the patient has been able to take all his medications as prescribed. ?ED course: ?Upon arrival the patient was hypotensive & tachycardic,  spitting up foamy phlegm with coffee-ground material. CT imaging shows malposition of the PEG tube in the peritoneal cavity, not inside the intestine/stomach.  Pneumoperitoneum was also noted.  Surgery was contacted for admission and emergent intervention. ?  ?Patient was taken to the OR for exploratory laparotomy on 12/31/2021.  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  Patient was taken to ICU postoperatively due to peritonitis and possible for perforation. ?  ?He is currently weaned off from pressors. ?  ?Worsening respiratory status, currently on heated high flow at 45 L of oxygen. ?  ?He was also placed on TPN until bowel functions resume. ?He is currently on Zosyn and Diflucan. ?Palliative care was also consulted to discuss goals of care. ?Very high risk for deterioration and death. ?  ?January 26, 2023; patient was started on low-dose Levophed due to persistent hypotension not responding to IV fluid.  Currently blood pressure stable.  Clinically seems the same.  General surgery is going to do GI series tomorrow via NG tube.  Palliative care was also consulted and a family meeting is planned for tomorrow around noon.  We will continue current level of care at this time. ?  ?3/22: Patient had upper GI series which was negative for any leak, surgery is planning to start trickle feed through G-tube. ?Palliative care meeting with family, patient is not DNR with full scope of medical care.  If he deteriorates then they are open to discuss about comfort care but would like to continue current level of care at this time. ?Remains very lethargic but off  the pressors now, able to wean off to 2 L of oxygen. ?  ?3/23: Clinically seems improving.  Still feeling weak.  Able to wean off to room air now. ?There was some trouble getting TPN from [facility], currently on trickle enteric feed and will reach his goal feeding by  tomorrow.  Also having some mild diarrhea. ?  ?3/24: Nursing concern of excessive upper respiratory thick secretions, requiring frequent suctioning.  Does not like a vibrating bed for chest PT. [place] is seen added. ?Surgery to progress to goal rate of tube feed today. ?We will continue holding TPN at this time. ?PT/OT evaluation for a potential discharge in next couple of days. ?  ?Later informed by nursing staff that patient developed a transient episode of atrial fibrillation, EKG was obtained which is consistent with rate controlled atrial fibrillation.  Patient reverted back to sinus rhythm spontaneously after a very short time.  We will monitor at this time. ?  ?3/25: Patient did develop another episode of hypotension yesterday evening requiring 1 L of bolus with improvement in blood pressure.  Worsening leukocytosis and low-grade fever this morning.  Patient continued to scream help whenever awake and resting in between. ?Repeat CT with some concern of worsening bilateral pleural effusion and new groundglass opacities in the upper lobe, broad differential which include aspiration pneumonia, patient is already on antibiotics.  CT abdomen improving and complete resolution of prior pneumoperitoneum. ?Checking procalcitonin. ?  ?Patient developed another febrile episode with temperature of 101.6.  Checking blood and urine cultures, checking respiratory cultures.  Discussed with son to revisit the goals of care, he will discuss with his dad and come back to [place]. ?  ?Patient is very high risk for deterioration.  Will request palliative care to revisit as family might have changed their perspective. ?  ?3/26: Patient continued to decline.  Becoming hypotensive and febrile again.  Repeat blood cultures remain negative, respiratory culture and urine cultures pending.  Will check MRSA swab and it came back positive.  CT chest which was done yesterday with some concern of new bilateral groundglass opacities with a broad  differential which include aspiration pneumonia versus an atypical infection.  He received last dose of Zosyn yesterday. ?Also had another discussion with son as he might be appropriate for comfort care. ?Giving some more boluses-if family decided to continue with medical care, he needs to be transferred back to ICU for pressors. ?Starting him on Unasyn, Zithromax and vancomycin. ?Patient also has diarrhea since started on tube feed, unable to explain any worsening of abdominal pain, always says yes to pain, worsening leukocytosis and new fever.  He was critically sick and received Zosyn for many days-high risk for C. difficile colitis-message sent to ID to proceed with C. difficile testing if appropriate. ?  ?Family decided to make him comfort care only and withdrawing all the treatment on 01/09/2022. Comfort care measures started and antibiotics were discontinued. ?  ?Family decided to move him to hospice home in [place]. TOC to send referral for inpatient hospice care. ?  ?Above hospital course reviewed from previous notes, I started taking care of patient on 01/14/2022.   ? ?Assessment and Plan: ?* Pneumoperitoneum, Patient not transition to comfort care only.  Withdrawing all treatment. Secondary to dislodged intraperitoneal gastrostomy tube s/p exploratory laparotomy, repair, insertion of new gastrostomy tube and takedown of gastrocolonic fistula. ?GI series with no leak- s/p  enteric feeding with G-tube and TPN has been discontinued. ?Completed the course of Zosyn  and Diflucan, s/p PPI IV twice daily. Continue with supportive care ?Sepsis ([place]) As patient continued to decline, family decided to proceed with comfort care only and withdrawing all the treatment. Patient now met sepsis criteria with fever and leukocytosis.  He also had diarrhea since start off enteric feeding, initially thought to be due to tube feed and a change in formula was requested. S/p Zosyn for 7 days. ?Repeat CT chest yesterday with new  upper lobe groundglass opacities with a broad differential which include aspiration pneumonia or atypical infection. Procalcitonin at 0.36. ?CT abdomen with concern of some reflex, otherwise improvement from pr

## 2022-02-14 NOTE — TOC Progression Note (Signed)
Transition of Care (TOC) - Progression Note  ? ? ?Patient Details  ?Name: Mike Parks ?MRN: [id number] ?Date of Birth: [birth year] ? ?Transition of Care (TOC) CM/SW Contact  ?Jaydn Moscato E Mystie Ormand, LCSW ?Phone Number: ?Feb 09, 2022, 10:36 AM ? ?Clinical Narrative:   Patient is on [facility] waitlist. ?CSW reached out to [place] with [facility] to inquire status of bed availability. ? ? ? ?Expected Discharge Plan: Skilled Nursing Facility ?Barriers to Discharge: Continued Medical Work up ? ?Expected Discharge Plan and Services ?Expected Discharge Plan: Skilled Nursing Facility ?  ?Discharge Planning Services: CM Consult ?Post Acute Care Choice: Skilled Nursing Facility ?Living arrangements for the past 2 months: Apartment ?                ?DME Arranged: N/A ?DME Agency: NA ?  ?  ?  ?HH Arranged: NA ?HH Agency: NA ?  ?  ?  ? ? ?Social Determinants of Health (SDOH) Interventions ?  ? ?Readmission Risk Interventions ?   ? View : No data to display.  ?  ?  ?  ? ? ?

## 2022-02-14 DEATH — deceased

## 2023-06-29 IMAGING — CT CT CHEST-ABD-PELV W/O CM
2 of 4 series · 12 of 46 positions shown, 14 images · non-contrast
Comparison: CT the chest, abdomen pelvis-12/31/2021; upper GI
series performed via existing gastrostomy tube-01/05/2022

CLINICAL DATA: Sepsis.  Hematemesis.

Patient with recent exploratory laparotomy and gastric tube
placement.
EXAM:
CT CHEST, ABDOMEN AND PELVIS WITHOUT CONTRAST
TECHNIQUE: Multidetector CT imaging of the chest, abdomen and pelvis was
performed following the standard protocol without IV contrast.
RADIATION DOSE REDUCTION: This exam was performed according to the
departmental dose-optimization program which includes automated
exposure control, adjustment of the mA and/or kV according to
patient size and/or use of iterative reconstruction technique.

[Series 2: cap without · axial · non-contrast · 0.82mm/px · z∈[-1355,-830]mm · 9 of 123 slices shown, 11 images]
[im 9/123  soft-tissue]
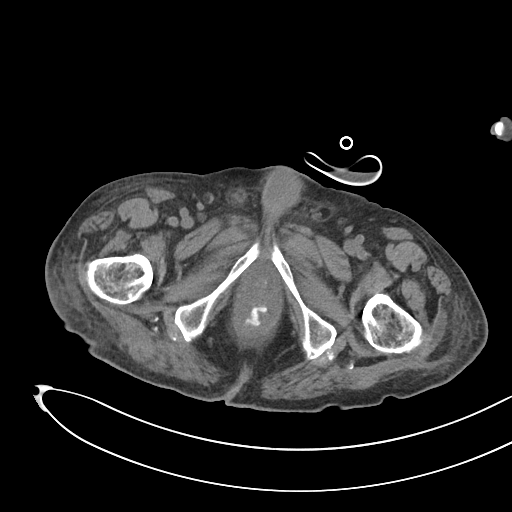
[im 9/123  bone]
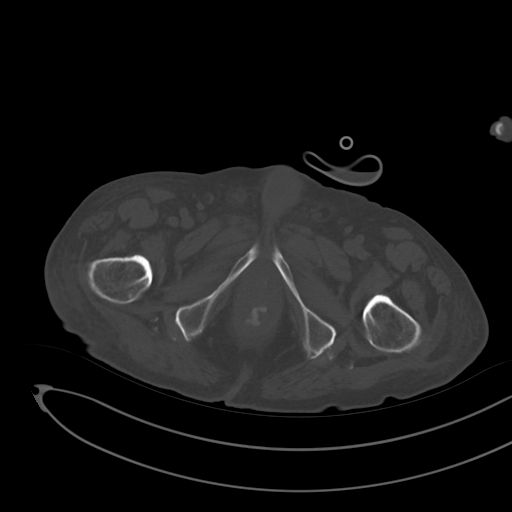
[im 25/123  soft-tissue]
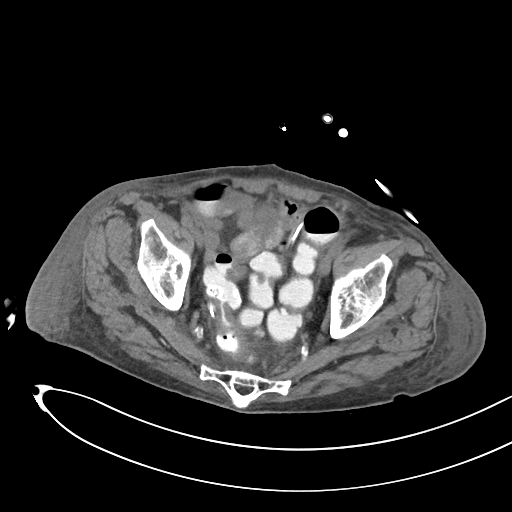
[im 33/123  soft-tissue]
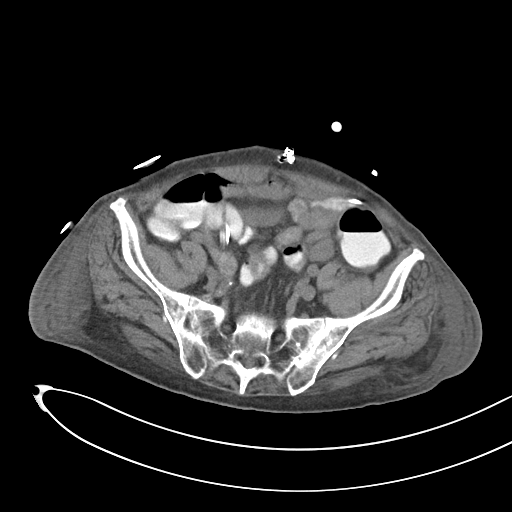
[im 49/123  soft-tissue]
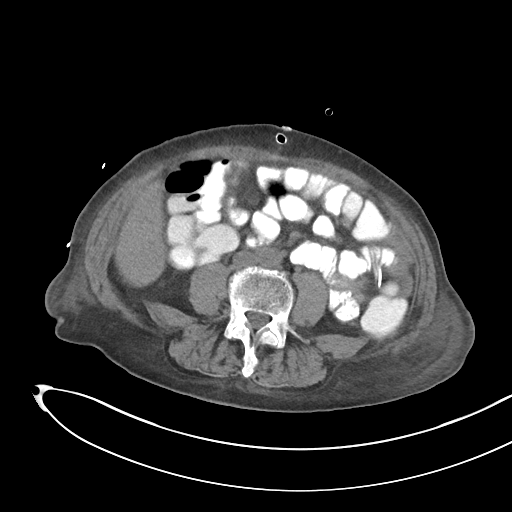
[im 66/123  soft-tissue]
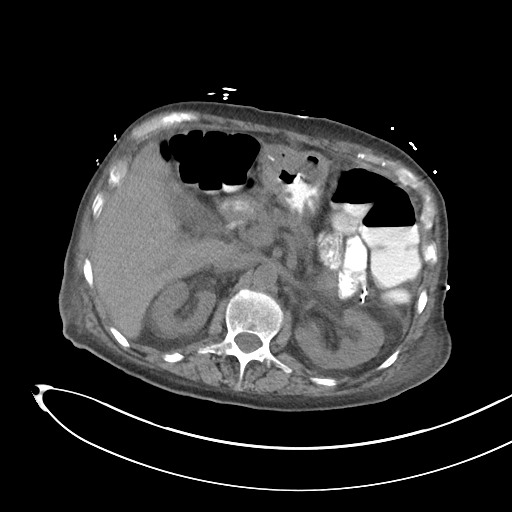
[im 74/123  soft-tissue]
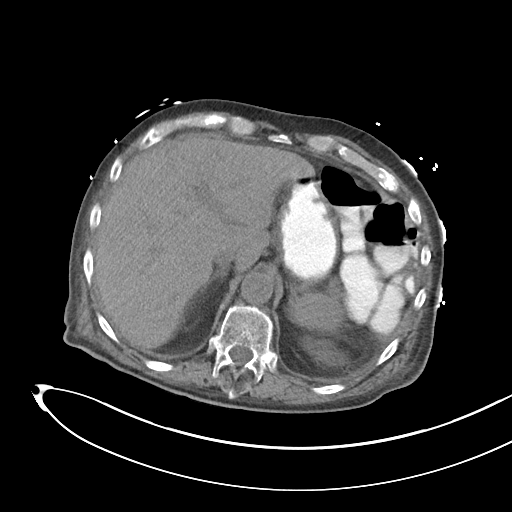
[im 90/123  soft-tissue]
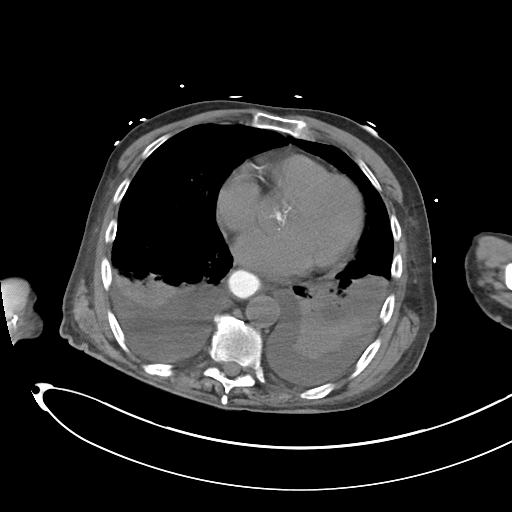
[im 98/123  soft-tissue]
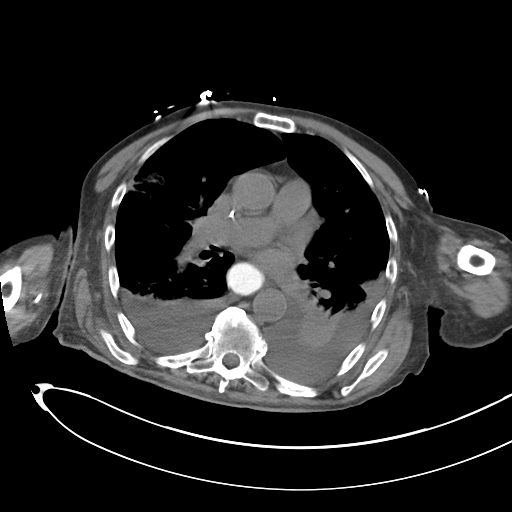
[im 114/123  soft-tissue]
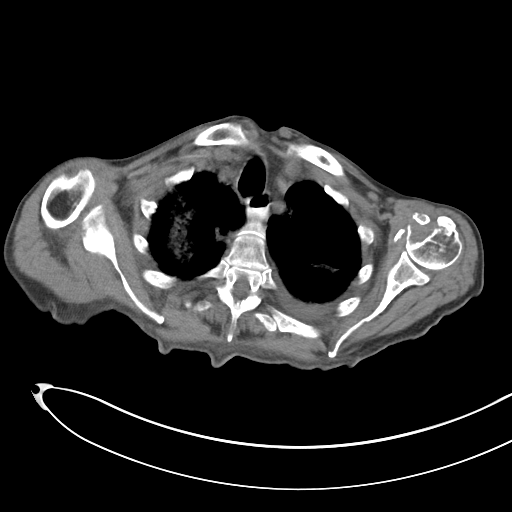
[im 114/123  bone]
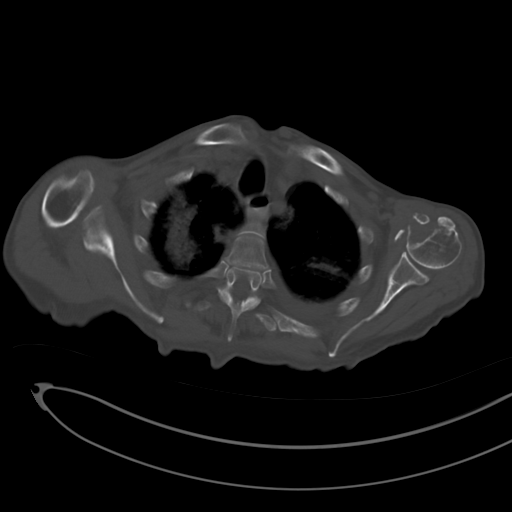

[Series 5: cor · coronal · 0.73mm/px · 3 of 89 slices shown]
[im 30/89  soft-tissue]
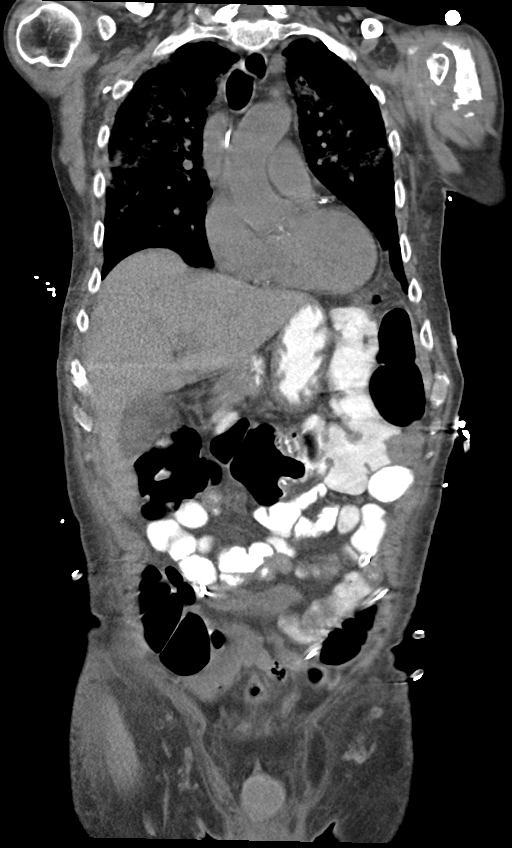
[im 40/89  soft-tissue]
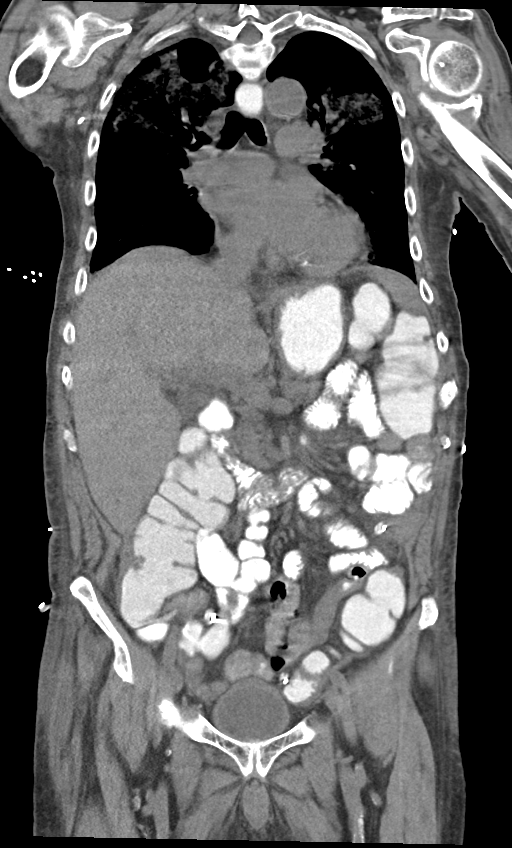
[im 49/89  soft-tissue]
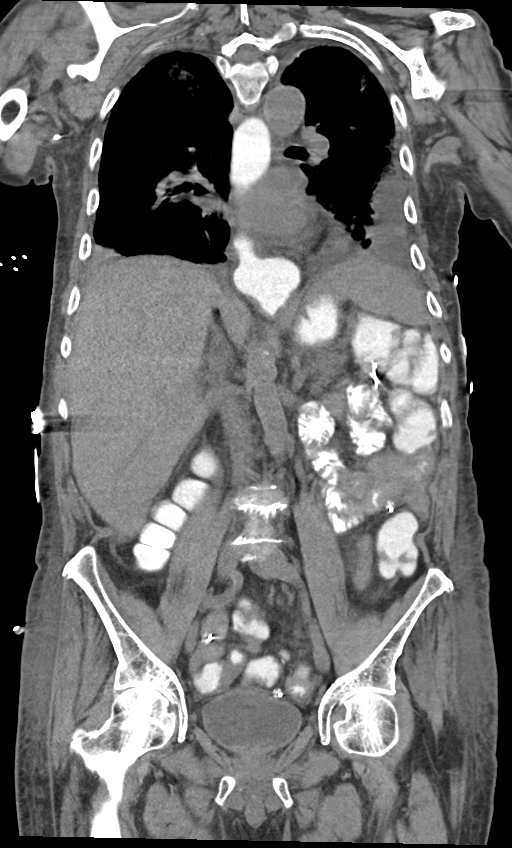

[12 of 46 positions shown; findings below may reference images not displayed]

FINDINGS: The lack of intravenous contrast limits the ability to evaluate
intrathoracic and abdominal organs.

CT CHEST FINDINGS

Cardiovascular: Normal heart size. Diffuse decreased attenuation of
the intra cardiac blood pool suggestive of anemia. Coronary artery
calcifications. Calcifications involving the aortic valve leaflets.
No evidence of thoracic aortic aneurysm or intramural hematoma.
Conventional configuration of the aortic arch. Normal caliber the
main pulmonary artery.

Right upper extremity approach PICC line tip terminates within the
superior cavoatrial junction.

Mediastinum/Nodes: No bulky mediastinal, hilar or axillary
lymphadenopathy on this noncontrast examination.

Lungs/Pleura: Interval development of small bilateral pleural
effusions with associated bibasilar consolidative opacities.
Interval development of extensive bilateral upper lung predominant
ground-glass consolidative opacities with associated air
bronchograms. No pneumothorax.

Redemonstrated left basilar calcified granulomas with dominant
granuloma measuring 1.9 cm (image 38, series 2).

Musculoskeletal: No acute osseous abnormalities. Chronic fracture
deformity involving the proximal humerus as demonstrated on remote
left shoulder radiographs performed 02/24/2021

_________________________________________________________

CT ABDOMEN PELVIS FINDINGS

Hepatobiliary: Normal hepatic contour. Normal noncontrast appearance
of the gallbladder given degree distention. No radiopaque
gallstones. No ascites.

Pancreas: Normal noncontrast appearance of the pancreas.

Spleen: Normal noncontrast appearance of the spleen.

Adrenals/Urinary Tract: Normal noncontrast appearance the bilateral
kidneys. No evidence of nephrolithiasis. No urinary obstruction or
perinephric stranding.

Normal noncontrast appearance the bilateral adrenal glands.

There is a small amount of air seen within non dependent portion of
the urinary bladder, presumably the sequela of recent Foley
catheterization.

Stomach/Bowel: Appropriately positioned gastrostomy tube. Enteric
contrast refluxes to the level of the cervical esophagus. Enteric
contrast extends to the level of the rectum. A rectal tube is in
place. No evidence of enteric obstruction. No discrete areas of
bowel wall thickening.

Near complete resolution of previously noted pneumoperitoneum with
tiny focus of residual pneumoperitoneum anterior to the right lobe
of the liver (image 43, series 2). No pneumatosis or portal venous
gas.

Vascular/Lymphatic: Atherosclerotic plaque within a normal caliber
abdominal aorta.

No bulky retroperitoneal, mesenteric, pelvic or inguinal
lymphadenopathy on this noncontrast examination.

Reproductive: Post hysterectomy.  No discrete adnexal lesions.

Other: Large bore surgical drainage catheters are seen bilaterally.
Well apposed midline abdominal incision with subcutaneous drainage
catheter. Diffuse body wall anasarca.

Musculoskeletal: Mild (approximately 25%) compression deformity
involving the superior endplate of L2 is similar to recent abdominal
CT performed 12/31/2021. Mild-to-moderate multilevel lumbar spine
DDD, worse at L4-L5 and L5-S1 with disc space height loss, endplate
irregularity and sclerosis.
IMPRESSION: Chest CT impression:

1. Interval development of small bilateral effusions with associated
bibasilar consolidative opacities and development of extensive
bilateral upper lung predominant ground-glass consolidative
opacities, nonspecific with broad differential considerations
including alveolar pulmonary edema, atypical infection and/or
aspiration. Clinical correlation is advised.
2. Old fracture involving the proximal aspect of the left humerus
with residual deformity.
3. Coronary artery calcifications. Aortic Atherosclerosis
([id number]).
4. Sequela of previous granulomatous infection.

Abdomen and pelvic CT impression:

1. Appropriately positioned gastrostomy tube.
2. Near complete resolution of previously noted pneumoperitoneum.
Surgical drainage catheters remain in place. No definable/drainable
fluid collections within the abdomen or pelvis on this noncontrast
examination.
3. Reflux of administered enteric contrast to the level of the
superior aspect of the esophagus.
4. No evidence of enteric obstruction with rectal tube in place.
5. Well apposed midline abdominal incision with subcutaneous
drainage catheter.
6. Mild (approximately 25%) compression deformity involving superior
endplate of L1, similar to recent abdominal CT performed 12/31/2021.
Correlation for point tenderness at this location is advised.
